# Patient Record
Sex: Male | Born: 1975
Health system: Southern US, Community
[De-identification: ages and names within clinical notes are randomized; demographics above are authoritative.]

## PROBLEM LIST (undated history)

## (undated) DIAGNOSIS — K649 Unspecified hemorrhoids: Secondary | ICD-10-CM

## (undated) DIAGNOSIS — E782 Mixed hyperlipidemia: Secondary | ICD-10-CM

## (undated) DIAGNOSIS — K219 Gastro-esophageal reflux disease without esophagitis: Secondary | ICD-10-CM

## (undated) HISTORY — PX: ROTATOR CUFF REPAIR: SHX139

## (undated) HISTORY — DX: Mixed hyperlipidemia: E78.2

## (undated) HISTORY — DX: Unspecified hemorrhoids: K64.9

---

## 2000-09-27 ENCOUNTER — Emergency Department (HOSPITAL_COMMUNITY): Admission: EM | Admit: 2000-09-27 | Discharge: 2000-09-27 | Payer: Self-pay

## 2004-04-29 ENCOUNTER — Emergency Department (HOSPITAL_COMMUNITY): Admission: EM | Admit: 2004-04-29 | Discharge: 2004-04-29 | Payer: Self-pay | Admitting: Emergency Medicine

## 2004-06-22 ENCOUNTER — Ambulatory Visit (HOSPITAL_COMMUNITY): Admission: RE | Admit: 2004-06-22 | Discharge: 2004-06-22 | Payer: Self-pay | Admitting: Family Medicine

## 2004-12-18 ENCOUNTER — Ambulatory Visit (HOSPITAL_COMMUNITY): Admission: RE | Admit: 2004-12-18 | Discharge: 2004-12-18 | Payer: Self-pay | Admitting: Family Medicine

## 2005-08-19 ENCOUNTER — Ambulatory Visit: Payer: Self-pay | Admitting: Internal Medicine

## 2005-09-07 ENCOUNTER — Ambulatory Visit (HOSPITAL_COMMUNITY): Admission: RE | Admit: 2005-09-07 | Discharge: 2005-09-07 | Payer: Self-pay | Admitting: Internal Medicine

## 2005-09-07 ENCOUNTER — Ambulatory Visit: Payer: Self-pay | Admitting: Internal Medicine

## 2005-09-07 HISTORY — PX: COLONOSCOPY: SHX174

## 2006-04-25 ENCOUNTER — Emergency Department (HOSPITAL_COMMUNITY): Admission: EM | Admit: 2006-04-25 | Discharge: 2006-04-26 | Payer: Self-pay | Admitting: Emergency Medicine

## 2006-04-28 ENCOUNTER — Ambulatory Visit (HOSPITAL_COMMUNITY): Admission: RE | Admit: 2006-04-28 | Discharge: 2006-04-28 | Payer: Self-pay | Admitting: Orthopaedic Surgery

## 2007-01-02 ENCOUNTER — Ambulatory Visit (HOSPITAL_BASED_OUTPATIENT_CLINIC_OR_DEPARTMENT_OTHER): Admission: RE | Admit: 2007-01-02 | Discharge: 2007-01-02 | Payer: Self-pay | Admitting: Orthopedic Surgery

## 2010-08-18 NOTE — Op Note (Signed)
Stephen [name], OBEY               ACCOUNT NO.:  [id number]   MEDICAL RECORD NO.:  [id number]          PATIENT TYPE:  AMB   LOCATION:  DSC                          FACILITY:  [facility]   PHYSICIAN:  Feliberto Gottron. Turner Daniels, M.D.   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   POSTOPERATIVE DIAGNOSIS:  Recurrent dislocations, left shoulder, with a  Bankart lesion by MRI scan.   PROCEDURE:  Open anterior superior capsular shift procedure with open  Bankart repair using a 3-mm push lock.   SURGEON:  Feliberto Gottron. Turner Daniels, M.D.   ASSISTANT:  Skip Mayer, P.A.-C.   ANESTHETIC:  Left shoulder interscalene block and general endotracheal.   ESTIMATED BLOOD LOSS:  Minimal.   FLUID REPLACEMENT:  800 mL crystalloid.   DRAINS PLACED:  None.   TOURNIQUET TIME:  None.   INDICATIONS FOR PROCEDURE:  The patient is a 35 year old man who in the  last couple of years has had 3 left shoulder anterior dislocations  documented by x-ray in the emergency room and requiring anesthetic to  reduce.  He has a small Hill-Sachs lesion on a recent MRI scan and a  fairly classic soft tissue Bankart lesion documented by MRI scan.  Back  a few months ago, he actually dislocated this rolling over in bed at  night.  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  The MRI scan was consistent with an anterior ballooned out capsule as  well.  Because of these findings, he desires elective left shoulder  anterior reconstruction with Bankart repair.  Risks and benefits of  surgery discussed, questions answered.  We are going to do this as an  open procedure because of the ballooning of the capsule which makes  arthroscopic stabilization somewhat difficult.   DESCRIPTION OF PROCEDURE:  The patient underwent left shoulder  interscalene block anesthesia in the block area at [facility] day  surgery center and then was taken to the operating room where the  appropriate anesthetic monitors were reattached, and he underwent  general endotracheal anesthesia.  He was then placed in the beach-chair  position/almost lying position and the left upper extremity prepped and  draped in the usual sterile fashion from the wrist to the hemithorax.  The skin from the axillary fold up towards the coracoid process was  infiltrated with 10 mL of half percent Marcaine and epinephrine  solution, and then a linear incision starting out just above the  axillary crease and going up towards and just above the coracoid process  was made through the skin and subcutaneous tissue.  Small bleeders were  identified and cauterized.  We went down to the deltopectoral interval,  identified the cephalic vein, and retracted it laterally with the  deltoid muscle.  This was exposed the clavipectoral fascia and the  subdeltoid bursa which were removed, exposing the conjoined tendon,  coming off of the coracoid process, and  the subscapularis muscle  beneath.  Using a self-retaining retractor, we then exposed the  subscapularis insertion.  This was taken off of the insertion on the  lesser tuberosity with the electrocautery and the curved Mayo scissors.  This was tagged with three #1 Ethibond suture using a grasping stitch,  allowing [place] to place the self-retaining retractor underneath the  subscapularis on top of the anterior shoulder capsule which was  ballooned out, especially anterior and inferiorly, consistent with his  recurrent dislocations.  This was then incised longitudinally starting  out superiorly and going down towards the inferior ballooned out aspect  of the capsule, and each side was tagged with #1 Ethilon sutures with a  grasping stitch, 4 medially and 3 laterally.  This exposed the  glenohumeral joint which was noted to be in good  condition with the  articular cartilage.  The patient had a fairly classic Bankart lesion  off of the anterior inferior glenoid.  Using a pilot drill for a 3-mm  push lock, a single pilot hole was placed on the anterior inferior rim  of the glenoid followed by tapping in preparation for 3-mm push lock.  A  #2 FiberWire was then passed through the capsule which been stripped off  of the anterior inferior acromion in a horizontal fashion and then two  limbs placed through the eye the push lock which was inserted, tensioned  and driven home.  This gave a firm stout repair of the anterior inferior  capsule labrum complex.  At this point, the wound was irrigated out with  normal saline solution, and then a capsular shift procedure, bringing  the ballooned out anterior inferior portion superior and lateral from  the medial limb and then superior and medial from the lateral limb was  accomplished.  This gave [place] a double-breasted repair of the capsule, and  the arm was held in 0 degrees of external rotation as the sutures were  tensioned and after the sutures had been tensioned external rotation  block at around 10 degrees.  Once again, the wound was then irrigated  out with normal saline solution.  The self-retaining retractor was  removed.  The subcutaneous tissue was closed with running 2-0 Vicryl  suture, the skin with skin staples and a dressing of Xeroform, 4x4  dressing, sponges, Hypafix tape and a shoulder immobilizer applied.  The  patient was then positioned supine, awakened and taken to the recovery  room without difficulty.      Feliberto Gottron. Turner Daniels, M.D.  Electronically Signed     FJR/MEDQ  D:  01/02/2007  T:  01/02/2007  Job:  [id number]

## 2010-08-21 NOTE — Op Note (Signed)
NAME:  [name], Stephen               ACCOUNT NO.:  [id number]   MEDICAL RECORD NO.:  [id number]          PATIENT TYPE:  AMB   LOCATION:  DAY                           FACILITY:  [facility]   PHYSICIAN:  R. Roetta Sessions, M.D. DATE OF BIRTH:  [birth year]   DATE OF PROCEDURE:  09/07/2005  DATE OF DISCHARGE:                                 OPERATIVE REPORT   PROCEDURE:  Diagnostic colonoscopy.   INDICATIONS FOR PROCEDURE:  A 35 year old gentleman with intermittent rectal  pain and intermittent rectal bleeding.  Colonoscopy is now being done.  This  approach has been discussed with patient at length.  The potential risks,  benefits, alternatives have been reviewed.  Patient tells me he had a [place]  sandwich yesterday.  He got very hungry and could not resist it.   PROCEDURE NOTE:  O2 saturation, blood pressure, pulse and respirations were  monitored throughout the entire procedure.  Conscious sedation with Versed 6  mg IV and Demerol 150 mg IV in divided doses.   INSTRUMENT:  Olympus video chip system.   FINDINGS:  Digital rectal exam revealed no abnormality.   ENDOSCOPIC FINDINGS:  Prep was marginal.   RECTUM:  On examination the rectal mucosa with retroflexion to the anal  verge revealed some minimal internal hemorrhoids.  Rectal mucosa otherwise  appeared normal.   COLON:  The colonic mucosa was surveyed from the rectosigmoid junction  through the left, transverse and right colon to the appendiceal orifice,  cecal valve and cecum.  These structures were well seen and photographed for  the record.  From this level, the scope was slowly withdrawn.  All  previously mentioned mucosal surfaces were again seen.  There was quite a  bit of semi-formed stool material throughout the colon which made the exam  more difficult.  I was able to wash and suction a good bit of it away, but  all the finer detail of mucosal surfaces were not seen because of the poor  prep (in large part due to the  patient's noncompliance with prep  instructions).  The colonic mucosa seen appeared entirely normal.  The  patient tolerated the procedure well and was reactive to endoscopist.   IMPRESSION:  Minimal internal hemorrhoids, otherwise normal rectum, normal  appearing colonic mucosa, however poor prep compromised exam.   I suspect the patient has experienced intermittent benign anorectal  bleeding.  He likely has an occult fissure, in large part exacerbated by  drinking large volumes of alcohol in binge manner over the weekends.   RECOMMENDATIONS:  1.  I have asked him to significantly curtail his alcohol consumption and      limit beer consumption over the weekend to no more than 6.  Hemorrhoid      literature provided to Stephen [name].  2.  Anusol HC suppositories 1 per rectum at bedtime x 10 days.  At his      request, we did a PSA on him through the office.  It was normal at 0.51.      Also, his liver profile and CBC came back normal.  Stephen [name] is to let me know if he has continued rectal bleeding or pain.      Stephen [name], M.D.  Electronically Signed     RMR/MEDQ  D:  09/07/2005  T:  09/07/2005  Job:  [id number]   cc:   [facility]

## 2010-08-21 NOTE — H&P (Signed)
NAME:  Stephen [name], Stephen [name]                   ACCOUNT NO.:  0   MEDICAL RECORD NO.:  [id number]           PATIENT TYPE:   LOCATION:                                 FACILITY:   PHYSICIAN:  R. Roetta Sessions, M.D. DATE OF BIRTH:  [birth year]   DATE OF ADMISSION:  08/19/2005  DATE OF DISCHARGE:  LH                                HISTORY & PHYSICAL   CHIEF COMPLAINT:  Hematochezia and proctalgia.   HISTORY OF PRESENT ILLNESS:  Mr. Stephen [name] is a pleasant, 5 year old,  Caucasian male followed by [facility] who came to see me on his own to  evaluate a 2- to 3-year history of intermittent, lancinating perianal rectal  pain which radiates towards his penis.  He may have some gross blood per  rectum from time to time, not at all necessarily associated with having a  bowel movement.  He denies constipation or diarrhea.  He has a tendency of  binge drinking consuming upwards of 24+ beers on a weekend which tends to  cause him to have more bowel movements and this is when he tends to have  more problems with burning and bleeding.  He has not really had any  abdominal pain or melena.  There has been no change in weight.  No upper GI  tract symptoms such as odynophagia, dysphagia, early satiety, reflux  symptoms, nausea or vomiting.   He tells me he has had rectal exams by various physicians.  He has never  taken anything topically for it, although it has been offered to him.  He  tells me he came to see me to get everything checked out.   PAST MEDICAL HISTORY:  Unremarkable for chronic illnesses or prior surgery,  although he tells me he did have some problems with increased urinary  frequency.  He describes Dr. Jerre [name] doing a cystoscopy at [facility]  several years ago without significant findings.   FAMILY HISTORY:  There is no family history of GI neoplasia or inflammatory  bowel disease.   CURRENT MEDICATIONS:  None.   ALLERGIES:  PENICILLIN.   FAMILY HISTORY:  Mother died  with a blood clot in her lung at age 29.  Father is age 73, alive and in good health, otherwise negative for chronic  GI or liver illness.   SOCIAL HISTORY:  The patient is married and has one step-child.  He has a  biological child on the way.  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  He smokes one to two cigarettes daily when he is drinking beer.  He had one DWI when he was 18.  He does not really drink during the week.  He does not use any illicit drugs.   REVIEW OF SYSTEMS:  As in history of present illness.  No change in weight,  fevers, chills, night sweats, chest pain or dyspnea on exertion.   PHYSICAL EXAMINATION:  GENERAL:  A pleasant, 35 year old gentleman resting  comfortably.  VITAL SIGNS:  Weight 173, height 5 feet 6 inches, temperature 98.4, BP  118/70, pulse 60.  SKIN:  Warm and dry.  No jaundice.  No stigmata of liver disease.  HEENT:  No scleral icterus.  NECK:  JVP not prominent.  CHEST:  Lungs clear to auscultation.  CARDIAC:  Regular rate and rhythm without murmurs, rubs or gallops.  ABDOMEN:  Nondistended, positive bowel sounds.  Soft, nontender without  appreciable mass or organomegaly.  EXTREMITIES:  No edema.  RECTAL:  Declined by the patient.  He wants everything done at the time of  colonoscopy.   IMPRESSION:  Mr. Stephen [name] is a pleasant, 35 year old gentleman with  intermittent rectal pain consistent with proctalgia in the setting of  intermittent bleeding and this is in the background setting of intermittent  binge alcohol consumption.   Symptoms sound more like a fissure more than anything else, however,  hematochezia needs to be thoroughly investigated.   PLAN:  I have recommended that Mr. Stephen [name] have a diagnostic colonoscopy in  the very near future.  Potential risks, benefits and alternatives have been  reviewed and questions answered.  He is agreeable.  He asked that a PSA  blood test be done and I have agreed to do so.  Will check a PSA  along with  a CBC and hepatic profile today.  Further recommendations to follow.      Jonathon Bellows, M.D.  Electronically Signed     RMR/MEDQ  D:  08/19/2005  T:  08/19/2005  Job:  [id number]   cc:   [facility]

## 2011-01-14 LAB — POCT HEMOGLOBIN-HEMACUE
Hemoglobin: 16.9
Operator id: 116011

## 2012-04-24 ENCOUNTER — Encounter: Payer: Self-pay | Admitting: Internal Medicine

## 2012-04-25 ENCOUNTER — Ambulatory Visit (INDEPENDENT_AMBULATORY_CARE_PROVIDER_SITE_OTHER): Payer: 59 | Admitting: Urgent Care

## 2012-04-25 ENCOUNTER — Encounter: Payer: Self-pay | Admitting: Urgent Care

## 2012-04-25 VITALS — BP 122/74 | HR 67 | Temp 98.0°F | Ht 66.0 in | Wt 177.2 lb

## 2012-04-25 DIAGNOSIS — K6289 Other specified diseases of anus and rectum: Secondary | ICD-10-CM

## 2012-04-25 DIAGNOSIS — K921 Melena: Secondary | ICD-10-CM

## 2012-04-25 MED ORDER — PEG 3350-KCL-NA BICARB-NACL 420 G PO SOLR: 4000.0000 mL | ORAL | Status: DC

## 2012-04-25 MED ORDER — LIDOCAINE-HYDROCORTISONE ACE 3-1 % RE KIT: 1.0000 "application " | PACK | Freq: Two times a day (BID) | RECTAL | Status: DC

## 2012-04-25 NOTE — Progress Notes (Signed)
Primary Care Physician:  Cassell Smiles., MD Primary Gastroenterologist:  Dr. Jena Gauss  Chief Complaint  Patient presents with  . Rectal Pain  . Hematochezia    HPI:  Stephen [name] is a 37 y.o. male here for hematochezia & proctalgia.  Last colonoscopy was in June 2007 for rectal bleeding.  He had a marginal prep.  He was found to have internal hemorrhoids only.  He describes pain "like a knife" in his anorectum.  C/o severe proctalgia after defecation.  He has noticed small amounts of bright red blood on the toilet tissue with wiping.  He tried Anucort only once or twice, but felt it made pain worse.  Denies constipation, diarrhea or abdominal pain.  Denies weight loss.  He is concerned he has prostate cancer.  He is unsure if he has had a recent PSA & he did have a routine physical.  He denies nausea, vomiting, dysphagia, odynophagia or anorexia.  He has rare heartburn.  He takes OTC Rolaids & they seem to help.  He only has symptoms a few times per month.  Denies NSAIDS.    Past Medical History  Diagnosis Date  . Hemorrhoids     Past Surgical History  Procedure Date  . Colonoscopy 09/07/2005    RMR: Minimal internal hemorrhoids, otherwise normal rectum, normal appearing colonic mucosa, however poor prep compromised exam (MARGINAL PREP)  . Rotator cuff repair     left    Current Outpatient Prescriptions  Medication Sig Dispense Refill  . lidocaine-hydrocortisone (ANAMANTLE) 3-1 % KIT Place 1 application rectally 2 (two) times daily.  20 each  0  . polyethylene glycol-electrolytes (TRILYTE) 420 G solution Take 4,000 mLs by mouth as directed.  4000 mL  0    Allergies as of 04/25/2012 - Review Complete 04/25/2012  Allergen Reaction Noted  . Penicillins Other (See Comments) 04/25/2012    Family History:There is no known family history of colorectal carcinoma , liver disease, or inflammatory bowel disease.   Problem Relation Age of Onset  . Pulmonary embolism Mother     History    Social History  . Marital Status: Married    Spouse Name: N/A    Number of Children: 3  . Years of Education: N/A   Occupational History  . Press Room Coordinator, [employer]    Social History Main Topics  . Smoking status: Current Some Day Smoker -- 0.2 packs/day    Types: Cigarettes  . Smokeless tobacco: Not on file  . Alcohol Use: Yes     Comment: 17 in a week, on weekends  . Drug Use: No  . Sexually Active: Not on file   Other Topics Concern  . Not on file   Social History Narrative  . No narrative on file    Review of Systems: Gen: see HPI CV: Denies chest pain, angina, palpitations, syncope, orthopnea, PND, peripheral edema, and claudication. Resp: Denies dyspnea at rest, dyspnea with exercise, cough, sputum, wheezing, coughing up blood, and pleurisy. GI: Denies vomiting blood, jaundice, and fecal incontinence.   GU : Denies urinary burning, blood in urine, urinary frequency, urinary hesitancy, nocturnal urination, and urinary incontinence. MS: Denies joint pain, limitation of movement, and swelling, stiffness, low back pain, extremity pain. Denies muscle weakness, cramps, atrophy.  Derm: Denies rash, itching, dry skin, hives, moles, warts, or unhealing ulcers.  Psych: Denies depression, anxiety, memory loss, suicidal ideation, hallucinations, paranoia, and confusion. Heme: Denies bruising or enlarged lymph nodes. Neuro:  Denies any headaches, dizziness, paresthesias.  Endo:  Denies any problems with DM, thyroid, adrenal function.  Physical Exam: BP 122/74  Pulse 67  Temp 98 F (36.7 C) (Oral)  Ht 5\' 6"  (1.676 m)  Wt 177 lb 3.2 oz (80.377 kg)  BMI 28.60 kg/m2 No LMP for male patient. General:   Alert,  Well-developed, well-nourished, pleasant and cooperative in NAD Head:  Normocephalic and atraumatic. Eyes:  Sclera clear, no icterus.   Conjunctiva pink. Ears:  Normal auditory acuity. Nose:  No deformity, discharge, or lesions. Mouth:  No deformity or  lesions,oropharynx pink & moist. Neck:  Supple; no masses or thyromegaly. Lungs:  Clear throughout to auscultation.   No wheezes, crackles, or rhonchi. No acute distress. Heart:  Regular rate and rhythm; no murmurs, clicks, rubs,  or gallops. Abdomen:  Normal bowel sounds.  No bruits.  Soft, non-tender and non-distended without masses, hepatosplenomegaly or hernias noted.  No guarding or rebound tenderness.   Rectal:  No external or internal lesions visualized or palpated, however he is extremely tender on exam.  Small amount light brown stool was obtained from vault & hemoccult negative. Msk:  Symmetrical without gross deformities.  Pulses:  Normal pulses noted. Extremities:  No clubbing or edema. Neurologic:  Alert and  oriented x4;  grossly normal neurologically. Skin:  Intact without significant lesions or rashes. Lymph Nodes:  No significant cervical adenopathy. Psych:  Alert and cooperative. Normal mood and affect.

## 2012-04-25 NOTE — Assessment & Plan Note (Addendum)
Stephen [name] is a pleasant 37 y.o. male with proctalgia & hematochezia.  On last colonoscopy in 2007 by Dr Jena Gauss, he had a marginal prep.  Given the fact that last colonoscopy was over 6 years ago & small lesions may have been missed due to marginal prep, he should have a diagnostic colonoscopy at this time.  Differentials include anorectal fissure, internal hemorrhoids, proctitis or IBD, or colorectal polyp or carcinoma.  Colonoscopy with Dr Jena Gauss.  I have discussed risks & benefits which include, but are not limited to, bleeding, infection, perforation & drug reaction.  The patient agrees with this plan & written consent will be obtained.    Anamantle Forte BID x 10 days

## 2012-04-25 NOTE — Patient Instructions (Addendum)
Colonoscopy with Dr Renaldo Harrison [facility] forte twice daily as directed

## 2012-04-25 NOTE — Progress Notes (Signed)
Faxed to PCP

## 2012-05-15 ENCOUNTER — Ambulatory Visit (HOSPITAL_COMMUNITY): Admission: RE | Admit: 2012-05-15 | Payer: 59 | Source: Ambulatory Visit | Admitting: Internal Medicine

## 2012-05-15 ENCOUNTER — Encounter (HOSPITAL_COMMUNITY): Admission: RE | Payer: Self-pay | Source: Ambulatory Visit

## 2012-05-15 SURGERY — COLONOSCOPY
Anesthesia: Moderate Sedation

## 2012-05-24 ENCOUNTER — Telehealth: Payer: Self-pay | Admitting: Internal Medicine

## 2012-05-24 NOTE — Telephone Encounter (Signed)
Pt called to [facility] his tcs. Please call him back at [phone]

## 2012-05-24 NOTE — Telephone Encounter (Signed)
Patient is going to have to come back in for another opv because 30 day H&P has ran out

## 2012-05-31 NOTE — Telephone Encounter (Signed)
DG had called pt to [facility] and patient said he would call us back to [facility] when he had time

## 2012-08-01 ENCOUNTER — Ambulatory Visit (INDEPENDENT_AMBULATORY_CARE_PROVIDER_SITE_OTHER): Payer: 59 | Admitting: Gastroenterology

## 2012-08-01 ENCOUNTER — Encounter: Payer: Self-pay | Admitting: Gastroenterology

## 2012-08-01 ENCOUNTER — Other Ambulatory Visit: Payer: Self-pay

## 2012-08-01 VITALS — BP 110/71 | HR 57 | Temp 98.1°F | Ht 67.0 in | Wt 176.0 lb

## 2012-08-01 DIAGNOSIS — K625 Hemorrhage of anus and rectum: Secondary | ICD-10-CM

## 2012-08-01 DIAGNOSIS — K6289 Other specified diseases of anus and rectum: Secondary | ICD-10-CM

## 2012-08-01 DIAGNOSIS — K921 Melena: Secondary | ICD-10-CM

## 2012-08-01 DIAGNOSIS — K649 Unspecified hemorrhoids: Secondary | ICD-10-CM

## 2012-08-01 MED ORDER — PEG-KCL-NACL-NASULF-NA ASC-C 100 G PO SOLR: 1.0000 | ORAL | Status: DC

## 2012-08-01 NOTE — Assessment & Plan Note (Signed)
37 year old gentleman with history of intermittent proctalgia and hematochezia. Last colonoscopy in 2007 by Dr. Jena Gauss with a marginal prep. Differentials include anorectal fissure, hemorrhoids, proctitis or IBD, colorectal polyp or carcinoma. Colonoscopy in the near future with Dr. Jena Gauss.  I have discussed the risks, alternatives, benefits with regards to but not limited to the risk of reaction to medication, bleeding, infection, perforation and the patient is agreeable to proceed. Written consent to be obtained.

## 2012-08-01 NOTE — Patient Instructions (Addendum)
We have scheduled you for colonoscopy with Dr. Jena Gauss. Please see separate instructions.

## 2012-08-01 NOTE — Progress Notes (Signed)
Primary Care Physician:  FUSCO,LAWRENCE J., MD  Primary Gastroenterologist:  Michael Rourk, MD  Chief Complaint  Patient presents with  . Colonoscopy    HPI:  Stephen [name] is a 37 y.o. male here to schedule colonoscopy. He was last seen in January 2014 for hematochezia and proctalgia. Last colonoscopy June 2007 for rectal bleeding. He had a marginal prep at that time. He was found to have internal hemorrhoids only. He was scheduled for colonoscopy at his last office visit that he had to cancel it. He states he was doing well up until recently. He has had recurrent rectal pain especially after bowel movements. Difficult to sit at times due to the pain. Intermittent small amounts of bright red blood on the toilet tissue with wiping. Denies constipation, diarrhea, abdominal pain, weight loss. Denies nausea or vomiting, dysphagia, odynophagia, anorexia. Rarely has heartburn. Takes over-the-counter Rolaids at times which help. Denies NSAIDs.  Current Outpatient Prescriptions  Medication Sig Dispense Refill  . lidocaine-hydrocortisone (ANAMANTLE) 3-1 % KIT Place 1 application rectally 2 (two) times daily.  20 each  0  . polyethylene glycol-electrolytes (TRILYTE) 420 G solution Take 4,000 mLs by mouth as directed.  4000 mL  0   No current facility-administered medications for this visit.    Allergies as of 08/01/2012 - Review Complete 08/01/2012  Allergen Reaction Noted  . Penicillins Other (See Comments) 04/25/2012    Past Medical History  Diagnosis Date  . Hemorrhoids     Past Surgical History  Procedure Laterality Date  . Colonoscopy  09/07/2005    RMR: Minimal internal hemorrhoids, otherwise normal rectum, normal appearing colonic mucosa, however poor prep compromised exam (MARGINAL PREP)  . Rotator cuff repair      left    Family History  Problem Relation Age of Onset  . Pulmonary embolism Mother     History   Social History  . Marital Status: Married    Spouse Name:  N/A    Number of Children: 3  . Years of Education: N/A   Occupational History  . Press Room Coordinator, [employer]    Social History Main Topics  . Smoking status: Current Some Day Smoker -- 0.20 packs/day    Types: Cigarettes  . Smokeless tobacco: Not on file  . Alcohol Use: Yes     Comment: 17 in a week, on weekends  . Drug Use: No  . Sexually Active: Not on file   Other Topics Concern  . Not on file   Social History Narrative  . No narrative on file      ROS:  General: Negative for anorexia, weight loss, fever, chills, fatigue, weakness. Eyes: Negative for vision changes.  ENT: Negative for hoarseness, difficulty swallowing , nasal congestion. CV: Negative for chest pain, angina, palpitations, dyspnea on exertion, peripheral edema.  Respiratory: Negative for dyspnea at rest, dyspnea on exertion, cough, sputum, wheezing.  GI: See history of present illness. GU:  Negative for dysuria, hematuria, urinary incontinence, urinary frequency, nocturnal urination.  MS: Negative for joint pain, low back pain.  Derm: Negative for rash or itching.  Neuro: Negative for weakness, abnormal sensation, seizure, frequent headaches, memory loss, confusion.  Psych: Negative for anxiety, depression, suicidal ideation, hallucinations.  Endo: Negative for unusual weight change.  Heme: Negative for bruising or bleeding. Allergy: Negative for rash or hives.    Physical Examination:  BP 110/71  Pulse 57  Temp(Src) 98.1 F (36.7 C) (Oral)  Ht 5' 7" (1.702 m)  Wt 176 lb (  79.833 kg)  BMI 27.56 kg/m2   General: Well-nourished, well-developed in no acute distress.  Head: Normocephalic, atraumatic.   Eyes: Conjunctiva pink, no icterus. Mouth: Oropharyngeal mucosa moist and pink , no lesions erythema or exudate. Neck: Supple without thyromegaly, masses, or lymphadenopathy.  Lungs: Clear to auscultation bilaterally.  Heart: Regular rate and rhythm, no murmurs rubs or gallops.  Abdomen:  Bowel sounds are normal, nontender, nondistended, no hepatosplenomegaly or masses, no abdominal bruits or    hernia , no rebound or guarding.   Rectal: Not performed Extremities: No lower extremity edema. No clubbing or deformities.  Neuro: Alert and oriented x 4 , grossly normal neurologically.  Skin: Warm and dry, no rash or jaundice.   Psych: Alert and cooperative, normal mood and affect.   

## 2012-08-01 NOTE — Progress Notes (Signed)
Cc PCP 

## 2012-08-01 NOTE — Addendum Note (Signed)
Addended by: Lavena Bullion on: 08/01/2012 08:53 AM   Modules accepted: Orders

## 2012-08-04 ENCOUNTER — Encounter (HOSPITAL_COMMUNITY): Payer: Self-pay | Admitting: Pharmacy Technician

## 2012-08-17 ENCOUNTER — Ambulatory Visit (HOSPITAL_COMMUNITY)
Admission: RE | Admit: 2012-08-17 | Discharge: 2012-08-17 | Disposition: A | Discharge status: Home / Self Care | Payer: 59 | Source: Ambulatory Visit | Attending: Internal Medicine | Admitting: Internal Medicine

## 2012-08-17 ENCOUNTER — Encounter (HOSPITAL_COMMUNITY)
Admission: RE | Disposition: A | Discharge status: Home / Self Care | Payer: Self-pay | Source: Ambulatory Visit | Attending: Internal Medicine

## 2012-08-17 ENCOUNTER — Encounter (HOSPITAL_COMMUNITY): Payer: Self-pay | Admitting: *Deleted

## 2012-08-17 DIAGNOSIS — K921 Melena: Secondary | ICD-10-CM

## 2012-08-17 DIAGNOSIS — K649 Unspecified hemorrhoids: Secondary | ICD-10-CM

## 2012-08-17 DIAGNOSIS — K602 Anal fissure, unspecified: Secondary | ICD-10-CM | POA: Insufficient documentation

## 2012-08-17 DIAGNOSIS — K625 Hemorrhage of anus and rectum: Secondary | ICD-10-CM

## 2012-08-17 DIAGNOSIS — K6289 Other specified diseases of anus and rectum: Secondary | ICD-10-CM

## 2012-08-17 HISTORY — PX: COLONOSCOPY: SHX5424

## 2012-08-17 SURGERY — COLONOSCOPY
Anesthesia: Moderate Sedation

## 2012-08-17 MED ORDER — SODIUM CHLORIDE 0.9 % IV SOLN
INTRAVENOUS | Status: DC
  Administered 2012-08-17: 1000 mL via INTRAVENOUS

## 2012-08-17 MED ORDER — ONDANSETRON HCL 4 MG/2ML IJ SOLN
INTRAMUSCULAR | Status: AC
  Filled 2012-08-17: qty 2

## 2012-08-17 MED ORDER — PROMETHAZINE HCL 25 MG/ML IJ SOLN
INTRAMUSCULAR | Status: DC | PRN
  Administered 2012-08-17: 12.5 mg via INTRAVENOUS

## 2012-08-17 MED ORDER — STERILE WATER FOR IRRIGATION IR SOLN
Status: DC | PRN
  Administered 2012-08-17: 12:00:00

## 2012-08-17 MED ORDER — MIDAZOLAM HCL 5 MG/5ML IJ SOLN
INTRAMUSCULAR | Status: AC
  Filled 2012-08-17: qty 10

## 2012-08-17 MED ORDER — MEPERIDINE HCL 100 MG/ML IJ SOLN
INTRAMUSCULAR | Status: AC
  Filled 2012-08-17: qty 1

## 2012-08-17 MED ORDER — LIDOCAINE HCL 2 % EX GEL
CUTANEOUS | Status: DC | PRN
  Administered 2012-08-17: 1 via TOPICAL

## 2012-08-17 MED ORDER — MIDAZOLAM HCL 5 MG/5ML IJ SOLN
INTRAMUSCULAR | Status: DC | PRN
  Administered 2012-08-17: 2 mg via INTRAVENOUS
  Administered 2012-08-17: 1 mg via INTRAVENOUS
  Administered 2012-08-17 (×2): 2 mg via INTRAVENOUS

## 2012-08-17 MED ORDER — LIDOCAINE HCL 2 % EX GEL
CUTANEOUS | Status: AC
  Filled 2012-08-17: qty 30

## 2012-08-17 MED ORDER — MEPERIDINE HCL 100 MG/ML IJ SOLN
INTRAMUSCULAR | Status: DC | PRN
  Administered 2012-08-17 (×3): 50 mg via INTRAVENOUS

## 2012-08-17 MED ORDER — ONDANSETRON HCL 4 MG/2ML IJ SOLN
INTRAMUSCULAR | Status: DC | PRN
  Administered 2012-08-17: 4 mg via INTRAVENOUS

## 2012-08-17 MED ORDER — PROMETHAZINE HCL 25 MG/ML IJ SOLN
INTRAMUSCULAR | Status: AC
  Filled 2012-08-17: qty 1

## 2012-08-17 NOTE — Op Note (Signed)
[facility] [address] [place], [address]   COLONOSCOPY PROCEDURE REPORT  PATIENT: Stephen [name], Stephen [name]  MR#:         [id number] BIRTHDATE: December 30, 1975 , 37  yrs. old GENDER: Male ENDOSCOPIST: R.  Roetta Sessions, MD FACP FACG REFERRED BY:  Artis Delay, M.D. PROCEDURE DATE:  08/17/2012 PROCEDURE:     diagnostic ileocolonoscopy  INDICATIONS: anorectal pain; paper hematochezia  INFORMED CONSENT:  The risks, benefits, alternatives and imponderables including but not limited to bleeding, perforation as well as the possibility of a missed lesion have been reviewed.  The potential for biopsy, lesion removal, etc. have also been discussed.  Questions have been answered.  All parties agreeable. Please see the history and physical in the medical record for more information.  MEDICATIONS: Versed 7 mg IV and Demerol 150 mg IV in divided doses. Phenergan 12.5 mg the. Zofran 4 mg IV  DESCRIPTION OF PROCEDURE:  After a digital rectal exam was performed, the EC-[id number] ([id number])  colonoscope was advanced from the anus through the rectum and colon to the area of the cecum, ileocecal valve and appendiceal orifice.  The cecum was deeply intubated.  These structures were well-seen and photographed for the record.  From the level of the cecum and ileocecal valve, the scope was slowly and cautiously withdrawn.  The mucosal surfaces were carefully surveyed utilizing scope tip deflection to facilitate fold flattening as needed.  The scope was pulled down into the rectum where a thorough examination including retroflexion was performed.    FINDINGS:  Adequate preparation. Exquisitely tender anal canal to digital examination. Normal rectal mucosa. Retroflexion reveals no abnormalities. No hemorrhoids.  Normal-appearing colonic mucosa. The distal 10 cm of terminal ileal mucosa also appeared normal  THERAPEUTIC / DIAGNOSTIC MANEUVERS PERFORMED:  None  COMPLICATIONS: None  CECAL  WITHDRAWAL TIME:  7 minutes  IMPRESSION:  Normal rectum, colon and terminal ileum. Exquisitely tender anal canal most just above the anal fissure. No significant hemorrhoidal disease. Symptoms are not consistent with proctalgia fugax  RECOMMENDATIONS:   Trial of nitroglycerin topical ointment; if no improvement, patient will need surgical referral. Discussed with wife.   _______________________________ eSigned:  R. Roetta Sessions, MD FACP [facility] 08/17/2012 12:32 PM   CC:

## 2012-08-17 NOTE — Interval H&P Note (Signed)
History and Physical Interval Note:  08/17/2012 11:44 AM  Maggie Font  has presented today for surgery, with the diagnosis of Rectal pain/ BRBPR/Hemorrhoids  The various methods of treatment have been discussed with the patient and family. After consideration of risks, benefits and other options for treatment, the patient has consented to  Procedure(s) with comments: COLONOSCOPY (N/A) - 10:45 AM-moved to [address] to notify pt as a surgical intervention .  The patient's history has been reviewed, patient examined, no change in status, stable for surgery.  I have reviewed the patient's chart and labs.  Questions were answered to the patient's satisfaction.     Heidie Krall  Topical lidocaine and hydrocortisone have not helped rectal pain. Colonoscopy per plan.  The risks, benefits, limitations, alternatives and imponderables have been reviewed with the patient. Questions have been answered. All parties are agreeable.

## 2012-08-17 NOTE — H&P (View-Only) (Signed)
Primary Care Physician:  Cassell Smiles., MD  Primary Gastroenterologist:  Roetta Sessions, MD  Chief Complaint  Patient presents with  . Colonoscopy    HPI:  Stephen [name] is a 37 y.o. male here to schedule colonoscopy. He was last seen in January 2014 for hematochezia and proctalgia. Last colonoscopy June 2007 for rectal bleeding. He had a marginal prep at that time. He was found to have internal hemorrhoids only. He was scheduled for colonoscopy at his last office visit that he had to cancel it. He states he was doing well up until recently. He has had recurrent rectal pain especially after bowel movements. Difficult to sit at times due to the pain. Intermittent small amounts of bright red blood on the toilet tissue with wiping. Denies constipation, diarrhea, abdominal pain, weight loss. Denies nausea or vomiting, dysphagia, odynophagia, anorexia. Rarely has heartburn. Takes over-the-counter Rolaids at times which help. Denies NSAIDs.  Current Outpatient Prescriptions  Medication Sig Dispense Refill  . lidocaine-hydrocortisone (ANAMANTLE) 3-1 % KIT Place 1 application rectally 2 (two) times daily.  20 each  0  . polyethylene glycol-electrolytes (TRILYTE) 420 G solution Take 4,000 mLs by mouth as directed.  4000 mL  0   No current facility-administered medications for this visit.    Allergies as of 08/01/2012 - Review Complete 08/01/2012  Allergen Reaction Noted  . Penicillins Other (See Comments) 04/25/2012    Past Medical History  Diagnosis Date  . Hemorrhoids     Past Surgical History  Procedure Laterality Date  . Colonoscopy  09/07/2005    RMR: Minimal internal hemorrhoids, otherwise normal rectum, normal appearing colonic mucosa, however poor prep compromised exam (MARGINAL PREP)  . Rotator cuff repair      left    Family History  Problem Relation Age of Onset  . Pulmonary embolism Mother     History   Social History  . Marital Status: Married    Spouse Name:  N/A    Number of Children: 3  . Years of Education: N/A   Occupational History  . Press Room Coordinator, [employer]    Social History Main Topics  . Smoking status: Current Some Day Smoker -- 0.20 packs/day    Types: Cigarettes  . Smokeless tobacco: Not on file  . Alcohol Use: Yes     Comment: 18 in a week, on weekends  . Drug Use: No  . Sexually Active: Not on file   Other Topics Concern  . Not on file   Social History Narrative  . No narrative on file      ROS:  General: Negative for anorexia, weight loss, fever, chills, fatigue, weakness. Eyes: Negative for vision changes.  ENT: Negative for hoarseness, difficulty swallowing , nasal congestion. CV: Negative for chest pain, angina, palpitations, dyspnea on exertion, peripheral edema.  Respiratory: Negative for dyspnea at rest, dyspnea on exertion, cough, sputum, wheezing.  GI: See history of present illness. GU:  Negative for dysuria, hematuria, urinary incontinence, urinary frequency, nocturnal urination.  MS: Negative for joint pain, low back pain.  Derm: Negative for rash or itching.  Neuro: Negative for weakness, abnormal sensation, seizure, frequent headaches, memory loss, confusion.  Psych: Negative for anxiety, depression, suicidal ideation, hallucinations.  Endo: Negative for unusual weight change.  Heme: Negative for bruising or bleeding. Allergy: Negative for rash or hives.    Physical Examination:  BP 110/71  Pulse 57  Temp(Src) 98.1 F (36.7 C) (Oral)  Ht 5\' 7"  (1.702 m)  Wt 176 lb (  79.833 kg)  BMI 27.56 kg/m2   General: Well-nourished, well-developed in no acute distress.  Head: Normocephalic, atraumatic.   Eyes: Conjunctiva pink, no icterus. Mouth: Oropharyngeal mucosa moist and pink , no lesions erythema or exudate. Neck: Supple without thyromegaly, masses, or lymphadenopathy.  Lungs: Clear to auscultation bilaterally.  Heart: Regular rate and rhythm, no murmurs rubs or gallops.  Abdomen:  Bowel sounds are normal, nontender, nondistended, no hepatosplenomegaly or masses, no abdominal bruits or    hernia , no rebound or guarding.   Rectal: Not performed Extremities: No lower extremity edema. No clubbing or deformities.  Neuro: Alert and oriented x 4 , grossly normal neurologically.  Skin: Warm and dry, no rash or jaundice.   Psych: Alert and cooperative, normal mood and affect.

## 2012-08-18 ENCOUNTER — Encounter (HOSPITAL_COMMUNITY): Payer: Self-pay | Admitting: Internal Medicine

## 2016-02-03 DIAGNOSIS — Z0000 Encounter for general adult medical examination without abnormal findings: Secondary | ICD-10-CM | POA: Diagnosis not present

## 2016-02-03 DIAGNOSIS — Z6828 Body mass index (BMI) 28.0-28.9, adult: Secondary | ICD-10-CM | POA: Diagnosis not present

## 2016-02-03 DIAGNOSIS — Z1389 Encounter for screening for other disorder: Secondary | ICD-10-CM | POA: Diagnosis not present

## 2016-02-03 DIAGNOSIS — E748 Other specified disorders of carbohydrate metabolism: Secondary | ICD-10-CM | POA: Diagnosis not present

## 2016-02-03 DIAGNOSIS — F419 Anxiety disorder, unspecified: Secondary | ICD-10-CM | POA: Diagnosis not present

## 2016-02-03 DIAGNOSIS — E663 Overweight: Secondary | ICD-10-CM | POA: Diagnosis not present

## 2016-04-19 DIAGNOSIS — L600 Ingrowing nail: Secondary | ICD-10-CM | POA: Diagnosis not present

## 2016-04-19 DIAGNOSIS — M79673 Pain in unspecified foot: Secondary | ICD-10-CM | POA: Diagnosis not present

## 2016-11-22 DIAGNOSIS — Z0000 Encounter for general adult medical examination without abnormal findings: Secondary | ICD-10-CM | POA: Diagnosis not present

## 2016-11-22 DIAGNOSIS — E663 Overweight: Secondary | ICD-10-CM | POA: Diagnosis not present

## 2016-11-22 DIAGNOSIS — Z1389 Encounter for screening for other disorder: Secondary | ICD-10-CM | POA: Diagnosis not present

## 2016-11-22 DIAGNOSIS — Z6828 Body mass index (BMI) 28.0-28.9, adult: Secondary | ICD-10-CM | POA: Diagnosis not present

## 2017-02-14 DIAGNOSIS — H43393 Other vitreous opacities, bilateral: Secondary | ICD-10-CM | POA: Diagnosis not present

## 2017-05-18 DIAGNOSIS — M2021 Hallux rigidus, right foot: Secondary | ICD-10-CM | POA: Diagnosis not present

## 2017-05-18 DIAGNOSIS — M79671 Pain in right foot: Secondary | ICD-10-CM | POA: Diagnosis not present

## 2017-05-18 DIAGNOSIS — M2022 Hallux rigidus, left foot: Secondary | ICD-10-CM | POA: Diagnosis not present

## 2017-05-18 DIAGNOSIS — M79672 Pain in left foot: Secondary | ICD-10-CM | POA: Diagnosis not present

## 2017-06-08 DIAGNOSIS — M205X1 Other deformities of toe(s) (acquired), right foot: Secondary | ICD-10-CM | POA: Diagnosis not present

## 2017-06-08 DIAGNOSIS — M19072 Primary osteoarthritis, left ankle and foot: Secondary | ICD-10-CM | POA: Diagnosis not present

## 2017-06-08 DIAGNOSIS — M7751 Other enthesopathy of right foot: Secondary | ICD-10-CM | POA: Diagnosis not present

## 2017-06-08 DIAGNOSIS — M19071 Primary osteoarthritis, right ankle and foot: Secondary | ICD-10-CM | POA: Diagnosis not present

## 2017-06-08 DIAGNOSIS — M659 Synovitis and tenosynovitis, unspecified: Secondary | ICD-10-CM | POA: Diagnosis not present

## 2017-11-23 DIAGNOSIS — R12 Heartburn: Secondary | ICD-10-CM | POA: Diagnosis not present

## 2017-11-23 DIAGNOSIS — M940 Chondrocostal junction syndrome [Tietze]: Secondary | ICD-10-CM | POA: Diagnosis not present

## 2017-11-23 DIAGNOSIS — Z6828 Body mass index (BMI) 28.0-28.9, adult: Secondary | ICD-10-CM | POA: Diagnosis not present

## 2017-11-23 DIAGNOSIS — Z0000 Encounter for general adult medical examination without abnormal findings: Secondary | ICD-10-CM | POA: Diagnosis not present

## 2017-11-23 DIAGNOSIS — Z1389 Encounter for screening for other disorder: Secondary | ICD-10-CM | POA: Diagnosis not present

## 2017-11-23 DIAGNOSIS — E663 Overweight: Secondary | ICD-10-CM | POA: Diagnosis not present

## 2017-11-23 DIAGNOSIS — Z0001 Encounter for general adult medical examination with abnormal findings: Secondary | ICD-10-CM | POA: Diagnosis not present

## 2017-11-23 DIAGNOSIS — E782 Mixed hyperlipidemia: Secondary | ICD-10-CM | POA: Diagnosis not present

## 2018-02-09 DIAGNOSIS — Z6828 Body mass index (BMI) 28.0-28.9, adult: Secondary | ICD-10-CM | POA: Diagnosis not present

## 2018-02-09 DIAGNOSIS — K219 Gastro-esophageal reflux disease without esophagitis: Secondary | ICD-10-CM | POA: Diagnosis not present

## 2018-02-09 DIAGNOSIS — E663 Overweight: Secondary | ICD-10-CM | POA: Diagnosis not present

## 2018-02-09 DIAGNOSIS — Z1389 Encounter for screening for other disorder: Secondary | ICD-10-CM | POA: Diagnosis not present

## 2018-02-09 DIAGNOSIS — R0789 Other chest pain: Secondary | ICD-10-CM | POA: Diagnosis not present

## 2018-03-06 ENCOUNTER — Ambulatory Visit (INDEPENDENT_AMBULATORY_CARE_PROVIDER_SITE_OTHER): Payer: Self-pay | Admitting: Internal Medicine

## 2018-06-26 DIAGNOSIS — E663 Overweight: Secondary | ICD-10-CM | POA: Diagnosis not present

## 2018-06-26 DIAGNOSIS — Z1389 Encounter for screening for other disorder: Secondary | ICD-10-CM | POA: Diagnosis not present

## 2018-06-26 DIAGNOSIS — Z6827 Body mass index (BMI) 27.0-27.9, adult: Secondary | ICD-10-CM | POA: Diagnosis not present

## 2018-06-26 DIAGNOSIS — J069 Acute upper respiratory infection, unspecified: Secondary | ICD-10-CM | POA: Diagnosis not present

## 2018-11-27 DIAGNOSIS — Z6827 Body mass index (BMI) 27.0-27.9, adult: Secondary | ICD-10-CM | POA: Diagnosis not present

## 2018-11-27 DIAGNOSIS — E7849 Other hyperlipidemia: Secondary | ICD-10-CM | POA: Diagnosis not present

## 2018-11-27 DIAGNOSIS — E663 Overweight: Secondary | ICD-10-CM | POA: Diagnosis not present

## 2018-11-27 DIAGNOSIS — Z0001 Encounter for general adult medical examination with abnormal findings: Secondary | ICD-10-CM | POA: Diagnosis not present

## 2019-05-10 ENCOUNTER — Other Ambulatory Visit: Payer: Self-pay

## 2019-05-10 ENCOUNTER — Ambulatory Visit: Payer: BC Managed Care – PPO | Attending: Internal Medicine

## 2019-05-10 DIAGNOSIS — Z20822 Contact with and (suspected) exposure to covid-19: Secondary | ICD-10-CM

## 2019-05-11 LAB — NOVEL CORONAVIRUS, NAA: SARS-CoV-2, NAA: NOT DETECTED

## 2019-05-15 ENCOUNTER — Other Ambulatory Visit: Payer: Self-pay

## 2019-05-15 ENCOUNTER — Ambulatory Visit: Payer: BC Managed Care – PPO | Attending: Internal Medicine

## 2019-05-15 DIAGNOSIS — U071 COVID-19: Secondary | ICD-10-CM | POA: Insufficient documentation

## 2019-05-15 DIAGNOSIS — Z20822 Contact with and (suspected) exposure to covid-19: Secondary | ICD-10-CM

## 2019-05-16 LAB — NOVEL CORONAVIRUS, NAA: SARS-CoV-2, NAA: DETECTED — AB

## 2019-05-17 DIAGNOSIS — R432 Parageusia: Secondary | ICD-10-CM | POA: Diagnosis not present

## 2019-05-17 DIAGNOSIS — U071 COVID-19: Secondary | ICD-10-CM | POA: Diagnosis not present

## 2019-05-17 DIAGNOSIS — R430 Anosmia: Secondary | ICD-10-CM | POA: Diagnosis not present

## 2019-12-04 DIAGNOSIS — Z1159 Encounter for screening for other viral diseases: Secondary | ICD-10-CM | POA: Diagnosis not present

## 2019-12-04 DIAGNOSIS — E663 Overweight: Secondary | ICD-10-CM | POA: Diagnosis not present

## 2019-12-04 DIAGNOSIS — Z6827 Body mass index (BMI) 27.0-27.9, adult: Secondary | ICD-10-CM | POA: Diagnosis not present

## 2019-12-04 DIAGNOSIS — Z1389 Encounter for screening for other disorder: Secondary | ICD-10-CM | POA: Diagnosis not present

## 2019-12-04 DIAGNOSIS — Z0000 Encounter for general adult medical examination without abnormal findings: Secondary | ICD-10-CM | POA: Diagnosis not present

## 2020-01-18 DIAGNOSIS — E7849 Other hyperlipidemia: Secondary | ICD-10-CM | POA: Diagnosis not present

## 2020-01-18 DIAGNOSIS — R7309 Other abnormal glucose: Secondary | ICD-10-CM | POA: Diagnosis not present

## 2020-01-18 DIAGNOSIS — Z6826 Body mass index (BMI) 26.0-26.9, adult: Secondary | ICD-10-CM | POA: Diagnosis not present

## 2020-01-18 DIAGNOSIS — E781 Pure hyperglyceridemia: Secondary | ICD-10-CM | POA: Diagnosis not present

## 2020-03-10 DIAGNOSIS — E785 Hyperlipidemia, unspecified: Secondary | ICD-10-CM | POA: Diagnosis not present

## 2020-03-10 DIAGNOSIS — Z6825 Body mass index (BMI) 25.0-25.9, adult: Secondary | ICD-10-CM | POA: Diagnosis not present

## 2020-03-10 DIAGNOSIS — E663 Overweight: Secondary | ICD-10-CM | POA: Diagnosis not present

## 2020-03-10 DIAGNOSIS — E781 Pure hyperglyceridemia: Secondary | ICD-10-CM | POA: Diagnosis not present

## 2020-05-14 ENCOUNTER — Encounter: Payer: Self-pay | Admitting: Internal Medicine

## 2020-05-14 ENCOUNTER — Encounter: Payer: Self-pay | Admitting: Nurse Practitioner

## 2020-05-14 ENCOUNTER — Other Ambulatory Visit: Payer: Self-pay

## 2020-05-14 ENCOUNTER — Ambulatory Visit (INDEPENDENT_AMBULATORY_CARE_PROVIDER_SITE_OTHER): Payer: BC Managed Care – PPO | Admitting: Nurse Practitioner

## 2020-05-14 VITALS — BP 114/73 | HR 68 | Temp 97.3°F | Ht 67.0 in | Wt 162.6 lb

## 2020-05-14 DIAGNOSIS — K649 Unspecified hemorrhoids: Secondary | ICD-10-CM | POA: Diagnosis not present

## 2020-05-14 DIAGNOSIS — K6289 Other specified diseases of anus and rectum: Secondary | ICD-10-CM | POA: Diagnosis not present

## 2020-05-14 DIAGNOSIS — K5900 Constipation, unspecified: Secondary | ICD-10-CM | POA: Insufficient documentation

## 2020-05-14 NOTE — Progress Notes (Signed)
Referring Provider: Elfredia Nevins, MD Primary Care Physician:  Elfredia Nevins, MD Primary GI:  Dr. Jena Gauss  Chief Complaint  Patient presents with  . Rectal Pain    Has seen little bit of blood  . Constipation    Sometimes strains, has bm daily    HPI:   Stephen [name] is a 45 y.o. male who presents for rectal pain.  The patient has not been seen by our office since 08/01/2012 when he was seen for hematochezia and proctalgia.  History of internal hemorrhoids.  Prior to his last visit had recurrent rectal pain after bowel movements with difficulty sitting.  No ongoing constipation or abdominal pain.  No other overt GI complaints.  Recommended colonoscopy.  Colonoscopy was completed 08/17/2012 which found exquisitely tender anal canal to digital rectal exam, normal mucosa, no hemorrhoids.  Appears to also have anal fissure.  Symptoms not consistent with proctalgia fugax.  Recommended trial of nitroglycerin topical ointment and if no improvement will require surgical referral.  Today states doing okay overall. He cannot remember if ntg topical helped last time. Has had intermittent rectal pain since October. Recent change in diet from triglyceride elevation. Has intermittent rectal pain, sometimes sharp and razor like. Sometimes throbbing. Always when he uses he bathroom, sometimes at night. Has daily bowel movements, requires some straining, taking longer then normal recently. Post-BM pain tends to linger. Denies hematochezia, melena. Has rectal itching/irritation as well with known hemorrhoids. Denies abdominal pain, N/V, melena, fever, chills. Notes initial intentional weight loss with diet/exercise from 183 to 163. Has not lost any more weight, has gone back to previous diet. Denies URI or flu-like symptoms. Denies loss of sense of taste or smell. The patient has not received COVID-19 vaccination(s). They are not interested in vaccine scheduling information. Denies chest pain, dyspnea,  dizziness, lightheadedness, syncope, near syncope. Denies any other upper or lower GI symptoms.  At the end of the visit he noted a "hard area" just on the edge/inside of his rectum and inquired about a rectal exam which was completed.  Past Medical History:  Diagnosis Date  . Elevated triglycerides with high cholesterol   . Hemorrhoids     Past Surgical History:  Procedure Laterality Date  . COLONOSCOPY  09/07/2005   RMR: Minimal internal hemorrhoids, otherwise normal rectum, normal appearing colonic mucosa, however poor prep compromised exam (MARGINAL PREP)  . COLONOSCOPY N/A 08/17/2012   Procedure: COLONOSCOPY;  Surgeon: Corbin Ade, MD;  Location: [facility];  Service: Endoscopy;  Laterality: N/A;  10:45 AM-moved to [address] to notify pt  . ROTATOR CUFF REPAIR     left    No current outpatient medications on file.   No current facility-administered medications for this visit.    Allergies as of 05/14/2020 - Review Complete 05/14/2020  Allergen Reaction Noted  . Penicillins Other (See Comments) 04/25/2012    Family History  Problem Relation Age of Onset  . Pulmonary embolism Mother   . Cancer Mother   . Colon cancer Neg Hx     Social History   Socioeconomic History  . Marital status: Married    Spouse name: Not on file  . Number of children: 3  . Years of education: Not on file  . Highest education level: Not on file  Occupational History  . Occupation: Customer service manager, Therapist, [employer]  Tobacco Use  . Smoking status: Former Smoker    Packs/day: 0.20    Years: 18.00  Pack years: 3.60    Types: Cigarettes  . Smokeless tobacco: Never Used  Substance and Sexual Activity  . Alcohol use: Yes    Comment: 18 beers in a week, on weekends; 05/14/20 6 beers once a week  . Drug use: No  . Sexual activity: Not on file  Other Topics Concern  . Not on file  Social History Narrative  . Not on file   Social Determinants of Health   Financial Resource  Strain: Not on file  Food Insecurity: Not on file  Transportation Needs: Not on file  Physical Activity: Not on file  Stress: Not on file  Social Connections: Not on file    Subjective: Review of Systems  Constitutional: Negative for chills, fever, malaise/fatigue and weight loss.  HENT: Negative for congestion and sore throat.   Respiratory: Negative for cough and shortness of breath.   Cardiovascular: Negative for chest pain and palpitations.  Gastrointestinal: Positive for constipation (Straining; no hard stools). Negative for abdominal pain, blood in stool, diarrhea, heartburn, melena, nausea and vomiting.       Rectal pain  Musculoskeletal: Negative for joint pain and myalgias.  Skin: Negative for rash.  Neurological: Negative for dizziness and weakness.  Endo/Heme/Allergies: Does not bruise/bleed easily.  Psychiatric/Behavioral: Negative for depression. The patient is not nervous/anxious.   All other systems reviewed and are negative.    Objective: BP 114/73   Pulse 68   Temp (!) 97.3 F (36.3 C) (Temporal)   Ht 5\' 7"  (1.702 m)   Wt 162 lb 9.6 oz (73.8 kg)   BMI 25.47 kg/m  Physical Exam Vitals and nursing note reviewed.  Constitutional:      General: He is not in acute distress.    Appearance: Normal appearance. He is normal weight. He is not ill-appearing, toxic-appearing or diaphoretic.  HENT:     Head: Normocephalic and atraumatic.     Nose: No congestion or rhinorrhea.  Eyes:     General: No scleral icterus. Cardiovascular:     Rate and Rhythm: Normal rate and regular rhythm.     Heart sounds: Normal heart sounds.  Pulmonary:     Effort: Pulmonary effort is normal.     Breath sounds: Normal breath sounds.  Abdominal:     General: Abdomen is flat. Bowel sounds are normal. There is no distension.     Palpations: Abdomen is soft. There is no hepatomegaly, splenomegaly or mass.     Tenderness: There is no abdominal tenderness. There is no guarding or  rebound.     Hernia: No hernia is present.  Genitourinary:    Rectum: Tenderness, anal fissure, external hemorrhoid and internal hemorrhoid present. No mass. Normal anal tone.  Musculoskeletal:     Cervical back: Neck supple.  Skin:    General: Skin is warm and dry.     Coloration: Skin is not jaundiced.     Findings: No bruising or rash.  Neurological:     General: No focal deficit present.     Mental Status: He is alert and oriented to person, place, and time. Mental status is at baseline.  Psychiatric:        Mood and Affect: Mood normal.        Behavior: Behavior normal.        Thought Content: Thought content normal.      Assessment:  Pleasant 45 year old male with a history of rectal pain, hemorrhoids, anal fissure presents for recurrence of rectal pain.  Denies hematochezia.  Colonoscopy  up-to-date approximately 8 years ago and reassuring.  No red flag/warning signs or symptoms.  Constipation: He denies hard stools but notes it does take him longer to have a bowel movement and he has significant straining.  His pain typically occurs after a bowel movement and lingers.  Not currently on any medications.  Rectal pain: Given his history, results of digital rectal exam, his exam today likely hemorrhoids and anal fissure recurrence worsened by constipation.  We will try to control his constipation and I will send in apothecary cream (Anusol with nifedipine, lidocaine).  He is to call us for any worsening symptoms.  No obvious bleeding noted.  Follow-up in 6 to 8 weeks and we can consider repeat colonoscopy if needed.  Hemorrhoids: Noted deflated hemorrhoid tag on external exam, appreciated likely 1-2 internal hemorrhoids not excessively enlarged.  He denies rectal bleeding at this time.  We can follow-up with [facility] if needed at follow-up.  Can consider colonoscopy for further evaluation depending on his clinical progress on recommended treatment.   Plan: 1. Increase water and fiber  intake 2. Colace daily 3. Apothecary cream with Anusol, nifedipine, lidocaine 4. Follow-up in 6 to 8 weeks 5. Consider repeat colonoscopy depending on clinical progress    Thank you for allowing [place] to participate in the care of Stephen [name]  Wynne Dust, DNP, AGNP-C Adult & Gerontological Nurse Practitioner [facility]   05/14/2020 8:48 AM   Disclaimer: This note was dictated with voice recognition software. Similar sounding words can inadvertently be transcribed and may not be corrected upon review.

## 2020-05-14 NOTE — Patient Instructions (Signed)
Your health issues we discussed today were:   Straining with stools: 1. Try to increase your water intake and your fiber intake 2. If you cannot consume enough fiber, you can consider adding a fiber supplement which is available over-the-counter 3. Start taking Colace stool softener 100 mg once a day to help you have less straining 4. Call if you see any bleeding or have any worsening symptoms  Hemorrhoids and likely anal fissure is likely source of rectal pain: 1. I will send in compounded rectal cream that will have a steroid component for your hemorrhoids, and anal fissure medication (diltiazem), and lidocaine for numbing 2. Call us for any worsening or severe symptoms  Overall I recommend:  1. Continue other current medications 2. Here for follow-up in 6 to 8 weeks 3. We can consider repeating colonoscopy at that time, if needed 4. Call if you have any questions or concerns   ---------------------------------------------------------------  At [facility] we value your feedback. You may receive a survey about your visit today. Please share your experience as we strive to create trusting relationships with our patients to provide genuine, compassionate, quality care.  We appreciate your understanding and patience as we review any laboratory studies, imaging, and other diagnostic tests that are ordered as we care for you. Our office policy is 5 business days for review of these results, and any emergent or urgent results are addressed in a timely manner for your best interest. If you do not hear from our office in 1 week, please contact us.   We also encourage the use of MyChart, which contains your medical information for your review as well. If you are not enrolled in this feature, an access code is on this after visit summary for your convenience. Thank you for allowing [place] to be involved in your care.  It was great to see you today!  I hope you have a safe and warm  winter!!     ---------------------------------------------------------------

## 2020-07-16 ENCOUNTER — Ambulatory Visit: Payer: BC Managed Care – PPO | Admitting: Nurse Practitioner

## 2020-09-10 ENCOUNTER — Other Ambulatory Visit: Payer: Self-pay | Admitting: Internal Medicine

## 2020-09-10 ENCOUNTER — Other Ambulatory Visit: Payer: Self-pay

## 2020-09-10 ENCOUNTER — Ambulatory Visit (HOSPITAL_COMMUNITY)
Admission: RE | Admit: 2020-09-10 | Discharge: 2020-09-10 | Disposition: A | Discharge status: Home / Self Care | Payer: BC Managed Care – PPO | Source: Ambulatory Visit | Attending: Internal Medicine | Admitting: Internal Medicine

## 2020-09-10 ENCOUNTER — Other Ambulatory Visit (HOSPITAL_COMMUNITY): Payer: Self-pay | Admitting: Internal Medicine

## 2020-09-10 DIAGNOSIS — H547 Unspecified visual loss: Secondary | ICD-10-CM | POA: Diagnosis not present

## 2020-09-10 DIAGNOSIS — G459 Transient cerebral ischemic attack, unspecified: Secondary | ICD-10-CM

## 2020-09-10 DIAGNOSIS — Z6826 Body mass index (BMI) 26.0-26.9, adult: Secondary | ICD-10-CM | POA: Diagnosis not present

## 2020-09-10 DIAGNOSIS — Z1331 Encounter for screening for depression: Secondary | ICD-10-CM | POA: Diagnosis not present

## 2020-09-10 DIAGNOSIS — G43109 Migraine with aura, not intractable, without status migrainosus: Secondary | ICD-10-CM | POA: Diagnosis not present

## 2020-09-10 DIAGNOSIS — E663 Overweight: Secondary | ICD-10-CM | POA: Diagnosis not present

## 2020-09-10 DIAGNOSIS — H538 Other visual disturbances: Secondary | ICD-10-CM | POA: Diagnosis not present

## 2020-10-14 DIAGNOSIS — H43393 Other vitreous opacities, bilateral: Secondary | ICD-10-CM | POA: Diagnosis not present

## 2020-11-03 DIAGNOSIS — D485 Neoplasm of uncertain behavior of skin: Secondary | ICD-10-CM | POA: Diagnosis not present

## 2020-11-03 DIAGNOSIS — L814 Other melanin hyperpigmentation: Secondary | ICD-10-CM | POA: Diagnosis not present

## 2020-11-03 DIAGNOSIS — D225 Melanocytic nevi of trunk: Secondary | ICD-10-CM | POA: Diagnosis not present

## 2020-12-05 DIAGNOSIS — R300 Dysuria: Secondary | ICD-10-CM | POA: Diagnosis not present

## 2020-12-05 DIAGNOSIS — N419 Inflammatory disease of prostate, unspecified: Secondary | ICD-10-CM | POA: Diagnosis not present

## 2020-12-05 DIAGNOSIS — E663 Overweight: Secondary | ICD-10-CM | POA: Diagnosis not present

## 2020-12-05 DIAGNOSIS — E782 Mixed hyperlipidemia: Secondary | ICD-10-CM | POA: Diagnosis not present

## 2020-12-05 DIAGNOSIS — Z0001 Encounter for general adult medical examination with abnormal findings: Secondary | ICD-10-CM | POA: Diagnosis not present

## 2020-12-05 DIAGNOSIS — E785 Hyperlipidemia, unspecified: Secondary | ICD-10-CM | POA: Diagnosis not present

## 2020-12-05 DIAGNOSIS — R7309 Other abnormal glucose: Secondary | ICD-10-CM | POA: Diagnosis not present

## 2020-12-05 DIAGNOSIS — Z6825 Body mass index (BMI) 25.0-25.9, adult: Secondary | ICD-10-CM | POA: Diagnosis not present

## 2020-12-05 DIAGNOSIS — K625 Hemorrhage of anus and rectum: Secondary | ICD-10-CM | POA: Diagnosis not present

## 2020-12-05 DIAGNOSIS — K648 Other hemorrhoids: Secondary | ICD-10-CM | POA: Diagnosis not present

## 2020-12-23 ENCOUNTER — Encounter: Payer: Self-pay | Admitting: Internal Medicine

## 2021-01-20 DIAGNOSIS — D225 Melanocytic nevi of trunk: Secondary | ICD-10-CM | POA: Diagnosis not present

## 2021-01-20 DIAGNOSIS — Q825 Congenital non-neoplastic nevus: Secondary | ICD-10-CM | POA: Diagnosis not present

## 2021-01-20 DIAGNOSIS — D485 Neoplasm of uncertain behavior of skin: Secondary | ICD-10-CM | POA: Diagnosis not present

## 2021-02-06 DIAGNOSIS — R1310 Dysphagia, unspecified: Secondary | ICD-10-CM | POA: Diagnosis not present

## 2021-02-06 DIAGNOSIS — E782 Mixed hyperlipidemia: Secondary | ICD-10-CM | POA: Diagnosis not present

## 2021-02-06 DIAGNOSIS — K219 Gastro-esophageal reflux disease without esophagitis: Secondary | ICD-10-CM | POA: Diagnosis not present

## 2021-02-06 DIAGNOSIS — Z6825 Body mass index (BMI) 25.0-25.9, adult: Secondary | ICD-10-CM | POA: Diagnosis not present

## 2021-02-06 DIAGNOSIS — E663 Overweight: Secondary | ICD-10-CM | POA: Diagnosis not present

## 2021-03-13 ENCOUNTER — Other Ambulatory Visit: Payer: Self-pay

## 2021-03-13 ENCOUNTER — Encounter: Payer: Self-pay | Admitting: Internal Medicine

## 2021-03-13 ENCOUNTER — Ambulatory Visit (INDEPENDENT_AMBULATORY_CARE_PROVIDER_SITE_OTHER): Payer: BC Managed Care – PPO | Admitting: Internal Medicine

## 2021-03-13 VITALS — BP 122/68 | HR 57 | Temp 97.1°F | Ht 67.0 in | Wt 167.4 lb

## 2021-03-13 DIAGNOSIS — R1319 Other dysphagia: Secondary | ICD-10-CM | POA: Diagnosis not present

## 2021-03-13 DIAGNOSIS — K6289 Other specified diseases of anus and rectum: Secondary | ICD-10-CM

## 2021-03-13 DIAGNOSIS — K219 Gastro-esophageal reflux disease without esophagitis: Secondary | ICD-10-CM

## 2021-03-13 DIAGNOSIS — K921 Melena: Secondary | ICD-10-CM | POA: Diagnosis not present

## 2021-03-13 MED ORDER — PANTOPRAZOLE SODIUM 40 MG PO TBEC
40.0000 mg | DELAYED_RELEASE_TABLET | Freq: Every day | ORAL | 11 refills | Status: DC
Start: 2021-03-13 — End: 2022-04-12

## 2021-03-13 NOTE — Progress Notes (Signed)
Primary Care Physician:  Elfredia Nevins, MD Primary Gastroenterologist:  Dr. Jena Gauss  Pre-Procedure History & Physical: HPI:  Stephen [name] is a 45 y.o. male here for their further evaluation of worsening GERD, intermittent esophageal dysphagia and occasional anorectal pain and paper hematochezia. Patient notes his heartburn has worsened over the past few months.  He has poor eating habits.  He eats fast.  He has noticed difficulty getting solid food down.  Feels like it sticks.  Started taking OTC Nexium with some improvement in reflux symptoms.  Readily admits to drinking at least 18 beers over the weekend with liquor on top of that.  No alcohol during the week.  He does note when he cuts back on alcohol his bowel symptoms settle down.  Has not had any nausea or vomiting.  He is lost 20 pounds recently to try to get his cholesterol under better control.  Had some internal hemorrhoids back on 2014 colonoscopy.  Was seen here earlier in the year for rectal pain felt to have a fissure.  He was given prescription hemorrhoid cream with nifedipine/Xylocaine.  States that helped to some degree.  Over the past 3 months, his anorectal symptoms have improved.  He denies constipation or diarrhea.  Has 1 bowel movement daily.  Positive family history of colon cancer in 2 maternal uncles.  No prior EGD.  He does not use tobacco.  Past Medical History:  Diagnosis Date   Elevated triglycerides with high cholesterol    Hemorrhoids     Past Surgical History:  Procedure Laterality Date   COLONOSCOPY  09/07/2005   RMR: Minimal internal hemorrhoids, otherwise normal rectum, normal appearing colonic mucosa, however poor prep compromised exam (MARGINAL PREP)   COLONOSCOPY N/A 08/17/2012   Procedure: COLONOSCOPY;  Surgeon: Corbin Ade, MD;  Location: [facility];  Service: Endoscopy;  Laterality: N/A;  10:45 AM-moved to [address] to notify pt   ROTATOR CUFF REPAIR     left    Prior to Admission  medications   Medication Sig Start Date End Date Taking? Authorizing Provider  esomeprazole (NEXIUM) 20 MG capsule Take 20 mg by mouth daily at 12 noon.   Yes [provider]    Allergies as of 03/13/2021 - Review Complete 03/13/2021  Allergen Reaction Noted   Penicillins Other (See Comments) 04/25/2012    Family History  Problem Relation Age of Onset   Pulmonary embolism Mother    Cancer Mother    Colon cancer Neg Hx     Social History   Socioeconomic History   Marital status: Married    Spouse name: Not on file   Number of children: 3   Years of education: Not on file   Highest education level: Not on file  Occupational History   Occupation: Customer service manager, Therapist, music care  Tobacco Use   Smoking status: Former    Packs/day: 0.20    Years: 18.00    Pack years: 3.60    Types: Cigarettes   Smokeless tobacco: Never  Substance and Sexual Activity   Alcohol use: Yes    Comment: 18 beers in a week, on weekends; 05/14/20 6 beers once a week   Drug use: No   Sexual activity: Not on file  Other Topics Concern   Not on file  Social History Narrative   Not on file   Social Determinants of Health   Financial Resource Strain: Not on file  Food Insecurity: Not on file  Transportation  Needs: Not on file  Physical Activity: Not on file  Stress: Not on file  Social Connections: Not on file  Intimate Partner Violence: Not on file    Review of Systems: See HPI, otherwise negative ROS  Physical Exam: BP 122/68   Pulse (!) 57   Temp (!) 97.1 F (36.2 C)   Ht 5\' 7"  (1.702 m)   Wt 167 lb 6.4 oz (75.9 kg)   BMI 26.22 kg/m  General:   Alert, pleasant and cooperative in NAD Neck:  Supple; no masses or thyromegaly. No significant cervical adenopathy. Lungs:  Clear throughout to auscultation.   No wheezes, crackles, or rhonchi. No acute distress. Heart:  Regular rate and rhythm; no murmurs, clicks, rubs,  or gallops. Abdomen: Non-distended, normal bowel sounds.   Soft and nontender without appreciable mass or hepatosplenomegaly.  Pulses:  Normal pulses noted. Extremities:  Without clubbing or edema. Rectal: Deferred until the time of colonoscopy.   Impression/Plan: Pleasant 45 year old gentleman resents with worsening GERD symptoms now esophageal dysphagia in the setting of reported binge alcohol consumption.  He is also had issues with intermittent pain in his anorectum with paper hematochezia.  Treated empirically for an anal fissure previously.  He does report the symptoms have subsided over the past 3 months. He really does not have much in the way of constipation or diarrhea.  I suspect binge alcohol consumption may be temporally related to his anorectal symptoms.  Patient is also made the observation this may be the case. Poor eating habits and alcohol consumption also likely contributing to his poorly controlled reflux.  Some improvement with OTC PPI therapy recently.  Further evaluation of his dysphagia and rectal bleeding warranted.  He needs better control of reflux and lifestyle modification.  Recommendations:  Stop Nexium  Begin Protonix 40 mg once daily 30 minutes before breakfast (new prescription - dispense 30 with 11 refills)  We will schedule an EGD with esophageal dilation (GERD and esophageal dysphagia) as well as a diagnostic colonoscopy (hematochezia).  ASA 3/propofol in the near future.  The risks, benefits, limitations, alternatives and imponderables have been reviewed with the patient. Questions have been answered. All parties are agreeable.    It is most important to stop consumption of beer and liquor.  Avoiding alcohol in the form of beer, liquor, wine, etc. will be of great benefit in helping acid reflux symptoms as well as bowel issues.  Further recommendations to follow after we have performed the upper endoscopy and colonoscopy..      Notice: This dictation was prepared with Dragon dictation along with smaller  phrase technology. Any transcriptional errors that result from this process are unintentional and may not be corrected upon review.

## 2021-03-13 NOTE — Patient Instructions (Signed)
It was good to see you again today!  Stop Nexium  Begin Protonix 40 mg once daily 30 minutes before breakfast (new prescription - dispense 30 with 11 refills)  We will schedule an EGD with esophageal dilation (GERD and esophageal dysphagia) as well as a diagnostic colonoscopy (hematochezia).  ASA 3/propofol in the near future  The most important thing I can tell you today is to ideally stop consumption of beer and liquor.  Avoiding alcohol in the form of beer, liquor, wine, etc. will be of great benefit in helping your acid reflux symptoms as well as bowel issues.  Further recommendations to follow after we have performed the upper endoscopy and colonoscopy.Marland Kitchen

## 2021-03-13 NOTE — H&P (View-Only) (Signed)
  Primary Care Physician:  Fusco, Lawrence, MD Primary Gastroenterologist:  Dr. Nayali Talerico  Pre-Procedure History & Physical: HPI:  Stephen [name] is a 45 y.o. male here for their further evaluation of worsening GERD, intermittent esophageal dysphagia and occasional anorectal pain and paper hematochezia. Patient notes his heartburn has worsened over the past few months.  He has poor eating habits.  He eats fast.  He has noticed difficulty getting solid food down.  Feels like it sticks.  Started taking OTC Nexium with some improvement in reflux symptoms.  Readily admits to drinking at least 18 beers over the weekend with liquor on top of that.  No alcohol during the week.  He does note when he cuts back on alcohol his bowel symptoms settle down.  Has not had any nausea or vomiting.  He is lost 20 pounds recently to try to get his cholesterol under better control.  Had some internal hemorrhoids back on 2014 colonoscopy.  Was seen here earlier in the year for rectal pain felt to have a fissure.  He was given prescription hemorrhoid cream with nifedipine/Xylocaine.  States that helped to some degree.  Over the past 3 months, his anorectal symptoms have improved.  He denies constipation or diarrhea.  Has 1 bowel movement daily.  Positive family history of colon cancer in 2 maternal uncles.  No prior EGD.  He does not use tobacco.  Past Medical History:  Diagnosis Date   Elevated triglycerides with high cholesterol    Hemorrhoids     Past Surgical History:  Procedure Laterality Date   COLONOSCOPY  09/07/2005   RMR: Minimal internal hemorrhoids, otherwise normal rectum, normal appearing colonic mucosa, however poor prep compromised exam (MARGINAL PREP)   COLONOSCOPY N/A 08/17/2012   Procedure: COLONOSCOPY;  Surgeon: Akshitha Culmer M Olivine Hiers, MD;  Location: [facility];  Service: Endoscopy;  Laterality: N/A;  10:45 AM-moved to [address] to notify pt   ROTATOR CUFF REPAIR     left    Prior to Admission  medications   Medication Sig Start Date End Date Taking? Authorizing Provider  esomeprazole (NEXIUM) 20 MG capsule Take 20 mg by mouth daily at 12 noon.   Yes [provider]    Allergies as of 03/13/2021 - Review Complete 03/13/2021  Allergen Reaction Noted   Penicillins Other (See Comments) 04/25/2012    Family History  Problem Relation Age of Onset   Pulmonary embolism Mother    Cancer Mother    Colon cancer Neg Hx     Social History   Socioeconomic History   Marital status: Married    Spouse name: Not on file   Number of children: 3   Years of education: Not on file   Highest education level: Not on file  Occupational History   Occupation: Press Room Coordinator, Lawn care  Tobacco Use   Smoking status: Former    Packs/day: 0.20    Years: 18.00    Pack years: 3.60    Types: Cigarettes   Smokeless tobacco: Never  Substance and Sexual Activity   Alcohol use: Yes    Comment: 18 beers in a week, on weekends; 05/14/20 6 beers once a week   Drug use: No   Sexual activity: Not on file  Other Topics Concern   Not on file  Social History Narrative   Not on file   Social Determinants of Health   Financial Resource Strain: Not on file  Food Insecurity: Not on file  Transportation   Needs: Not on file  Physical Activity: Not on file  Stress: Not on file  Social Connections: Not on file  Intimate Partner Violence: Not on file    Review of Systems: See HPI, otherwise negative ROS  Physical Exam: BP 122/68    Pulse (!) 57    Temp (!) 97.1 F (36.2 C)    Ht 5\' 7"  (1.702 m)    Wt 167 lb 6.4 oz (75.9 kg)    BMI 26.22 kg/m  General:   Alert, pleasant and cooperative in NAD Neck:  Supple; no masses or thyromegaly. No significant cervical adenopathy. Lungs:  Clear throughout to auscultation.   No wheezes, crackles, or rhonchi. No acute distress. Heart:  Regular rate and rhythm; no murmurs, clicks, rubs,  or gallops. Abdomen: Non-distended, normal bowel sounds.   Soft and nontender without appreciable mass or hepatosplenomegaly.  Pulses:  Normal pulses noted. Extremities:  Without clubbing or edema. Rectal: Deferred until the time of colonoscopy.   Impression/Plan: Pleasant 45 year old gentleman resents with worsening GERD symptoms now esophageal dysphagia in the setting of reported binge alcohol consumption.  He is also had issues with intermittent pain in his anorectum with paper hematochezia.  Treated empirically for an anal fissure previously.  He does report the symptoms have subsided over the past 3 months. He really does not have much in the way of constipation or diarrhea.  I suspect binge alcohol consumption may be temporally related to his anorectal symptoms.  Patient is also made the observation this may be the case. Poor eating habits and alcohol consumption also likely contributing to his poorly controlled reflux.  Some improvement with OTC PPI therapy recently.  Further evaluation of his dysphagia and rectal bleeding warranted.  He needs better control of reflux and lifestyle modification.  Recommendations:  Stop Nexium  Begin Protonix 40 mg once daily 30 minutes before breakfast (new prescription - dispense 30 with 11 refills)  We will schedule an EGD with esophageal dilation (GERD and esophageal dysphagia) as well as a diagnostic colonoscopy (hematochezia).  ASA 3/propofol in the near future.  The risks, benefits, limitations, alternatives and imponderables have been reviewed with the patient. Questions have been answered. All parties are agreeable.    It is most important to stop consumption of beer and liquor.  Avoiding alcohol in the form of beer, liquor, wine, etc. will be of great benefit in helping acid reflux symptoms as well as bowel issues.  Further recommendations to follow after we have performed the upper endoscopy and colonoscopy..      Notice: This dictation was prepared with Dragon dictation along with smaller  phrase technology. Any transcriptional errors that result from this process are unintentional and may not be corrected upon review.

## 2021-03-16 ENCOUNTER — Telehealth: Payer: Self-pay | Admitting: Internal Medicine

## 2021-03-16 NOTE — Telephone Encounter (Signed)
ERROR

## 2021-03-19 ENCOUNTER — Telehealth: Payer: Self-pay | Admitting: Internal Medicine

## 2021-03-19 ENCOUNTER — Telehealth: Payer: Self-pay

## 2021-03-19 ENCOUNTER — Other Ambulatory Visit: Payer: Self-pay

## 2021-03-19 MED ORDER — PEG 3350-KCL-NA BICARB-NACL 420 G PO SOLR: 4000.0000 mL | ORAL | 0 refills | Status: AC

## 2021-03-19 NOTE — Telephone Encounter (Signed)
Spoke to pt, TCS/EGD/DIL scheduled for 03/26/21 at 10:30am. Rx for prep sent to pharmacy. Orders entered. Instructions sent to his MyChart.

## 2021-03-19 NOTE — Telephone Encounter (Signed)
Tried to call pt to schedule TCS/EGD w/Propofol ASA 3 w/Dr. Rourk, LMOVM for return call. 

## 2021-03-19 NOTE — Telephone Encounter (Signed)
Patient returned call, said he was at work and would call after he gets off at 3

## 2021-03-19 NOTE — Telephone Encounter (Signed)
Called pt, informed him of pre-op appt 03/24/21 at 8:30am. Appt letter sent to MyChart.

## 2021-03-23 NOTE — Patient Instructions (Signed)
Stephen [name]  03/23/2021     @PREFPERIOPPHARMACY @   Your procedure is scheduled on  03/26/2021.   Report to 03/28/2021 at  0845  A.M.   Call this number if you have problems the morning of surgery:  [phone]   Remember:  Follow the diet and prep instructions given to you by the office.    Take these medicines the morning of surgery with A SIP OF WATER                                                 Protonix.     Do not wear jewelry, make-up or nail polish.  Do not wear lotions, powders, or perfumes, or deodorant.  Do not shave 48 hours prior to surgery.  Men may shave face and neck.  Do not bring valuables to the hospital.  [facility] is not responsible for any belongings or valuables.  Contacts, dentures or bridgework may not be worn into surgery.  Leave your suitcase in the car.  After surgery it may be brought to your room.  For patients admitted to the hospital, discharge time will be determined by your treatment team.  Patients discharged the day of surgery will not be allowed to drive home and must have someone with them for 24 hours.    Special instructions:   DO NOT smoke tobacco or vape for 24 hours before your procedure.  Please read over the following fact sheets that you were given. Anesthesia Post-op Instructions and Care and Recovery After Surgery      Upper Endoscopy, Adult, Care After This sheet gives you information about how to care for yourself after your procedure. Your health care provider may also give you more specific instructions. If you have problems or questions, contact your health care provider. What can I expect after the procedure? After the procedure, it is common to have: A sore throat. Mild stomach pain or discomfort. Bloating. Nausea. Follow these instructions at home:  Follow instructions from your health care provider about what to eat or drink after your procedure. Return to your normal activities as  told by your health care provider. Ask your health care provider what activities are safe for you. Take over-the-counter and prescription medicines only as told by your health care provider. If you were given a sedative during the procedure, it can affect you for several hours. Do not drive or operate machinery until your health care provider says that it is safe. Keep all follow-up visits as told by your health care provider. This is important. Contact a health care provider if you have: A sore throat that lasts longer than one day. Trouble swallowing. Get help right away if: You vomit blood or your vomit looks like coffee grounds. You have: A fever. Bloody, black, or tarry stools. A severe sore throat or you cannot swallow. Difficulty breathing. Severe pain in your chest or abdomen. Summary After the procedure, it is common to have a sore throat, mild stomach discomfort, bloating, and nausea. If you were given a sedative during the procedure, it can affect you for several hours. Do not drive or operate machinery until your health care provider says that it is safe. Follow instructions from your health care provider about what to eat or drink after your  procedure. Return to your normal activities as told by your health care provider. This information is not intended to replace advice given to you by your health care provider. Make sure you discuss any questions you have with your health care provider. Document Revised: 01/26/2019 Document Reviewed: 08/22/2017 Elsevier Patient Education  2022 [employer] Esophageal Dilatation Esophageal dilatation, also called esophageal dilation, is a procedure to widen or open a blocked or narrowed part of the esophagus. The esophagus is the part of the body that moves food and liquid from the mouth to the stomach. You may need this procedure if: You have a buildup of scar tissue in your esophagus that makes it difficult, painful, or impossible to  swallow. This can be caused by gastroesophageal reflux disease (GERD). You have cancer of the esophagus. There is a problem with how food moves through your esophagus. In some cases, you may need this procedure repeated at a later time to dilate the esophagus gradually. Tell a health care provider about: Any allergies you have. All medicines you are taking, including vitamins, herbs, eye drops, creams, and over-the-counter medicines. Any problems you or family members have had with anesthetic medicines. Any blood disorders you have. Any surgeries you have had. Any medical conditions you have. Any antibiotic medicines you are required to take before dental procedures. Whether you are pregnant or may be pregnant. What are the risks? Generally, this is a safe procedure. However, problems may occur, including: Bleeding due to a tear in the lining of the esophagus. A hole, or perforation, in the esophagus. What happens before the procedure? Ask your health care provider about: Changing or stopping your regular medicines. This is especially important if you are taking diabetes medicines or blood thinners. Taking medicines such as aspirin and ibuprofen. These medicines can thin your blood. Do not take these medicines unless your health care provider tells you to take them. Taking over-the-counter medicines, vitamins, herbs, and supplements. Follow instructions from your health care provider about eating or drinking restrictions. Plan to have a responsible adult take you home from the hospital or clinic. Plan to have a responsible adult care for you for the time you are told after you leave the hospital or clinic. This is important. What happens during the procedure? You may be given a medicine to help you relax (sedative). A numbing medicine may be sprayed into the back of your throat, or you may gargle the medicine. Your health care provider may perform the dilatation using various surgical  instruments, such as: Simple dilators. This instrument is carefully placed in the esophagus to stretch it. Guided wire bougies. This involves using an endoscope to insert a wire into the esophagus. A dilator is passed over this wire to enlarge the esophagus. Then the wire is removed. Balloon dilators. An endoscope with a small balloon is inserted into the esophagus. The balloon is inflated to stretch the esophagus and open it up. The procedure may vary among health care providers and hospitals. What can I expect after the procedure? Your blood pressure, heart rate, breathing rate, and blood oxygen level will be monitored until you leave the hospital or clinic. Your throat may feel slightly sore and numb. This will get better over time. You will not be allowed to eat or drink until your throat is no longer numb. When you are able to drink, urinate, and sit on the edge of the bed without nausea or dizziness, you may be able to return home. Follow these instructions  at home: Take over-the-counter and prescription medicines only as told by your health care provider. If you were given a sedative during the procedure, it can affect you for several hours. Do not drive or operate machinery until your health care provider says that it is safe. Plan to have a responsible adult care for you for the time you are told. This is important. Follow instructions from your health care provider about any eating or drinking restrictions. Do not use any products that contain nicotine or tobacco, such as cigarettes, e-cigarettes, and chewing tobacco. If you need help quitting, ask your health care provider. Keep all follow-up visits. This is important. Contact a health care provider if: You have a fever. You have pain that is not relieved by medicine. Get help right away if: You have chest pain. You have trouble breathing. You have trouble swallowing. You vomit blood. You have black, tarry, or bloody  stools. These symptoms may represent a serious problem that is an emergency. Do not wait to see if the symptoms will go away. Get medical help right away. Call your local emergency services (911 in the [place] Do not drive yourself to the hospital. Summary Esophageal dilatation, also called esophageal dilation, is a procedure to widen or open a blocked or narrowed part of the esophagus. Plan to have a responsible adult take you home from the hospital or clinic. For this procedure, a numbing medicine may be sprayed into the back of your throat, or you may gargle the medicine. Do not drive or operate machinery until your health care provider says that it is safe. This information is not intended to replace advice given to you by your health care provider. Make sure you discuss any questions you have with your health care provider. Document Revised: 08/08/2019 Document Reviewed: 08/08/2019 Elsevier Patient Education  2022 [employer]. Colonoscopy, Adult, Care After This sheet gives you information about how to care for yourself after your procedure. Your health care provider may also give you more specific instructions. If you have problems or questions, contact your health care provider. What can I expect after the procedure? After the procedure, it is common to have: A small amount of blood in your stool for 24 hours after the procedure. Some gas. Mild cramping or bloating of your abdomen. Follow these instructions at home: Eating and drinking  Drink enough fluid to keep your urine pale yellow. Follow instructions from your health care provider about eating or drinking restrictions. Resume your normal diet as instructed by your health care provider. Avoid heavy or fried foods that are hard to digest. Activity Rest as told by your health care provider. Avoid sitting for a long time without moving. Get up to take short walks every 1-2 hours. This is important to improve blood flow and  breathing. Ask for help if you feel weak or unsteady. Return to your normal activities as told by your health care provider. Ask your health care provider what activities are safe for you. Managing cramping and bloating  Try walking around when you have cramps or feel bloated. Apply heat to your abdomen as told by your health care provider. Use the heat source that your health care provider recommends, such as a moist heat pack or a heating pad. Place a towel between your skin and the heat source. Leave the heat on for 20-30 minutes. Remove the heat if your skin turns bright red. This is especially important if you are unable to feel pain, heat, or  cold. You may have a greater risk of getting burned. General instructions If you were given a sedative during the procedure, it can affect you for several hours. Do not drive or operate machinery until your health care provider says that it is safe. For the first 24 hours after the procedure: Do not sign important documents. Do not drink alcohol. Do your regular daily activities at a slower pace than normal. Eat soft foods that are easy to digest. Take over-the-counter and prescription medicines only as told by your health care provider. Keep all follow-up visits as told by your health care provider. This is important. Contact a health care provider if: You have blood in your stool 2-3 days after the procedure. Get help right away if you have: More than a small spotting of blood in your stool. Large blood clots in your stool. Swelling of your abdomen. Nausea or vomiting. A fever. Increasing pain in your abdomen that is not relieved with medicine. Summary After the procedure, it is common to have a small amount of blood in your stool. You may also have mild cramping and bloating of your abdomen. If you were given a sedative during the procedure, it can affect you for several hours. Do not drive or operate machinery until your health care  provider says that it is safe. Get help right away if you have a lot of blood in your stool, nausea or vomiting, a fever, or increased pain in your abdomen. This information is not intended to replace advice given to you by your health care provider. Make sure you discuss any questions you have with your health care provider. Document Revised: 01/26/2019 Document Reviewed: 10/16/2018 Elsevier Patient Education  2022 Else[employer] Inc. Monitored Anesthesia Care, Care After This sheet gives you information about how to care for yourself after your procedure. Your health care provider may also give you more specific instructions. If you have problems or questions, contact your health care provider. What can I expect after the procedure? After the procedure, it is common to have: Tiredness. Forgetfulness about what happened after the procedure. Impaired judgment for important decisions. Nausea or vomiting. Some difficulty with balance. Follow these instructions at home: For the time period you were told by your health care provider:   Rest as needed. Do not participate in activities where you could fall or become injured. Do not drive or use machinery. Do not drink alcohol. Do not take sleeping pills or medicines that cause drowsiness. Do not make important decisions or sign legal documents. Do not take care of children on your own. Eating and drinking Follow the diet that is recommended by your health care provider. Drink enough fluid to keep your urine pale yellow. If you vomit: Drink water, juice, or soup when you can drink without vomiting. Make sure you have little or no nausea before eating solid foods. General instructions Have a responsible adult stay with you for the time you are told. It is important to have someone help care for you until you are awake and alert. Take over-the-counter and prescription medicines only as told by your health care provider. If you have sleep apnea,  surgery and certain medicines can increase your risk for breathing problems. Follow instructions from your health care provider about wearing your sleep device: Anytime you are sleeping, including during daytime naps. While taking prescription pain medicines, sleeping medicines, or medicines that make you drowsy. Avoid smoking. Keep all follow-up visits as told by your health care provider. This  is important. Contact a health care provider if: You keep feeling nauseous or you keep vomiting. You feel light-headed. You are still sleepy or having trouble with balance after 24 hours. You develop a rash. You have a fever. You have redness or swelling around the IV site. Get help right away if: You have trouble breathing. You have new-onset confusion at home. Summary For several hours after your procedure, you may feel tired. You may also be forgetful and have poor judgment. Have a responsible adult stay with you for the time you are told. It is important to have someone help care for you until you are awake and alert. Rest as told. Do not drive or operate machinery. Do not drink alcohol or take sleeping pills. Get help right away if you have trouble breathing, or if you suddenly become confused. This information is not intended to replace advice given to you by your health care provider. Make sure you discuss any questions you have with your health care provider. Document Revised: 12/06/2019 Document Reviewed: 02/22/2019 Elsevier Patient Education  2022 [employer].

## 2021-03-24 ENCOUNTER — Other Ambulatory Visit: Payer: Self-pay

## 2021-03-24 ENCOUNTER — Encounter (HOSPITAL_COMMUNITY)
Admission: RE | Admit: 2021-03-24 | Discharge: 2021-03-24 | Disposition: A | Discharge status: Home / Self Care | Payer: BC Managed Care – PPO | Source: Ambulatory Visit | Attending: Internal Medicine | Admitting: Internal Medicine

## 2021-03-24 ENCOUNTER — Encounter (HOSPITAL_COMMUNITY): Payer: Self-pay

## 2021-03-24 VITALS — BP 124/70 | HR 61 | Temp 97.1°F | Resp 16 | Ht 67.0 in | Wt 167.4 lb

## 2021-03-24 DIAGNOSIS — K640 First degree hemorrhoids: Secondary | ICD-10-CM | POA: Diagnosis not present

## 2021-03-24 DIAGNOSIS — R1310 Dysphagia, unspecified: Secondary | ICD-10-CM | POA: Diagnosis not present

## 2021-03-24 DIAGNOSIS — F1090 Alcohol use, unspecified, uncomplicated: Secondary | ICD-10-CM | POA: Insufficient documentation

## 2021-03-24 DIAGNOSIS — K921 Melena: Secondary | ICD-10-CM | POA: Diagnosis not present

## 2021-03-24 DIAGNOSIS — Z01818 Encounter for other preprocedural examination: Secondary | ICD-10-CM | POA: Insufficient documentation

## 2021-03-24 DIAGNOSIS — K222 Esophageal obstruction: Secondary | ICD-10-CM | POA: Diagnosis not present

## 2021-03-24 HISTORY — DX: Gastro-esophageal reflux disease without esophagitis: K21.9

## 2021-03-24 LAB — COMPREHENSIVE METABOLIC PANEL
ALT: 20 U/L (ref 0–44)
AST: 18 U/L (ref 15–41)
Albumin: 4.2 g/dL (ref 3.5–5.0)
Alkaline Phosphatase: 49 U/L (ref 38–126)
Anion gap: 7 (ref 5–15)
BUN: 17 mg/dL (ref 6–20)
CO2: 27 mmol/L (ref 22–32)
Calcium: 9.1 mg/dL (ref 8.9–10.3)
Chloride: 105 mmol/L (ref 98–111)
Creatinine, Ser: 0.93 mg/dL (ref 0.61–1.24)
GFR, Estimated: >60 mL/min (ref >60)
Glucose, Bld: 110 mg/dL — ABNORMAL HIGH (ref 70–99)
Potassium: 3.7 mmol/L (ref 3.5–5.1)
Sodium: 139 mmol/L (ref 135–145)
Total Bilirubin: 1 mg/dL (ref 0.3–1.2)
Total Protein: 6.8 g/dL (ref 6.5–8.1)

## 2021-03-26 ENCOUNTER — Ambulatory Visit (HOSPITAL_COMMUNITY)
Admission: RE | Admit: 2021-03-26 | Discharge: 2021-03-26 | Disposition: A | Discharge status: Home / Self Care | Payer: BC Managed Care – PPO | Source: Ambulatory Visit | Attending: Internal Medicine | Admitting: Internal Medicine

## 2021-03-26 ENCOUNTER — Encounter (HOSPITAL_COMMUNITY)
Admission: RE | Disposition: A | Discharge status: Home / Self Care | Payer: Self-pay | Source: Ambulatory Visit | Attending: Internal Medicine

## 2021-03-26 ENCOUNTER — Ambulatory Visit (HOSPITAL_COMMUNITY): Payer: BC Managed Care – PPO | Admitting: Anesthesiology

## 2021-03-26 DIAGNOSIS — K222 Esophageal obstruction: Secondary | ICD-10-CM | POA: Insufficient documentation

## 2021-03-26 DIAGNOSIS — K640 First degree hemorrhoids: Secondary | ICD-10-CM | POA: Diagnosis not present

## 2021-03-26 DIAGNOSIS — K921 Melena: Secondary | ICD-10-CM | POA: Diagnosis not present

## 2021-03-26 DIAGNOSIS — K219 Gastro-esophageal reflux disease without esophagitis: Secondary | ICD-10-CM | POA: Diagnosis not present

## 2021-03-26 DIAGNOSIS — R1310 Dysphagia, unspecified: Secondary | ICD-10-CM | POA: Insufficient documentation

## 2021-03-26 HISTORY — PX: MALONEY DILATION: SHX5535

## 2021-03-26 HISTORY — PX: ESOPHAGOGASTRODUODENOSCOPY (EGD) WITH PROPOFOL: SHX5813

## 2021-03-26 HISTORY — PX: COLONOSCOPY WITH PROPOFOL: SHX5780

## 2021-03-26 SURGERY — COLONOSCOPY WITH PROPOFOL
Anesthesia: General

## 2021-03-26 MED ORDER — LACTATED RINGERS IV SOLN: INTRAVENOUS | Status: DC

## 2021-03-26 MED ORDER — PROPOFOL 10 MG/ML IV BOLUS
INTRAVENOUS | Status: DC | PRN
  Administered 2021-03-26: 50 mg via INTRAVENOUS
  Administered 2021-03-26: 100 mg via INTRAVENOUS
  Administered 2021-03-26 (×2): 50 mg via INTRAVENOUS
  Administered 2021-03-26: 150 mg via INTRAVENOUS
  Administered 2021-03-26: 50 mg via INTRAVENOUS

## 2021-03-26 MED ORDER — LIDOCAINE HCL (CARDIAC) PF 50 MG/5ML IV SOSY
PREFILLED_SYRINGE | INTRAVENOUS | Status: DC | PRN
  Administered 2021-03-26: 100 mg via INTRAVENOUS

## 2021-03-26 NOTE — Op Note (Signed)
[facility] Patient Name: Stephen [name] Procedure Date: 03/26/2021 10:29 AM MRN: [id number] Date of Birth: May 14, 1975 Attending MD: Gennette Pac , MD CSN: [id number] Age: 45 Admit Type: Outpatient Procedure:                Colonoscopy Indications:              Hematochezia Providers:                Gennette Pac, MD, Angelica Ran, Kristine L.                            Jessee Avers, Technician Referring MD:              Medicines:                Propofol per Anesthesia Complications:            No immediate complications. Estimated Blood Loss:     Estimated blood loss: none. Procedure:                Pre-Anesthesia Assessment:                           - Prior to the procedure, a History and Physical                            was performed, and patient medications and                            allergies were reviewed. The patient's tolerance of                            previous anesthesia was also reviewed. The risks                            and benefits of the procedure and the sedation                            options and risks were discussed with the patient.                            All questions were answered, and informed consent                            was obtained. Prior Anticoagulants: The patient has                            taken no previous anticoagulant or antiplatelet                            agents. ASA Grade Assessment: III - A patient with                            severe systemic disease. After reviewing the risks  and benefits, the patient was deemed in                            satisfactory condition to undergo the procedure.                           After obtaining informed consent, the colonoscope                            was passed under direct vision. Throughout the                            procedure, the patient's blood pressure, pulse, and                            oxygen saturations were  monitored continuously. The                            [id number]) scope was introduced through                            the anus and advanced to the the cecum, identified                            by appendiceal orifice and ileocecal valve. The                            colonoscopy was performed without difficulty. The                            patient tolerated the procedure well. The quality                            of the bowel preparation was adequate. Scope In: 10:33:01 AM Scope Out: 10:43:36 AM Scope Withdrawal Time: 0 hours 6 minutes 15 seconds  Total Procedure Duration: 0 hours 10 minutes 35 seconds  Findings:      The perianal and digital rectal examinations were normal.      Non-bleeding internal hemorrhoids were found during retroflexion. The       hemorrhoids were mild, small and Grade I (internal hemorrhoids that do       not prolapse).      The exam was otherwise without abnormality on direct and retroflexion       views. Impression:               - Non-bleeding internal hemorrhoids.                           - The examination was otherwise normal on direct                            and retroflexion views.                           - No specimens collected. Moderate Sedation:      Moderate (conscious)  sedation was personally administered by an       anesthesia professional. The following parameters were monitored: oxygen       saturation, heart rate, blood pressure, respiratory rate, EKG, adequacy       of pulmonary ventilation, and response to care. Recommendation:           - Patient has a contact number available for                            emergencies. The signs and symptoms of potential                            delayed complications were discussed with the                            patient. Return to normal activities tomorrow.                            Written discharge instructions were provided to the                            patient.                            - Resume previous diet.                           - Continue present medications. Continue to curtail                            beer consumption.                           - Repeat colonoscopy in 10 years for screening                            purposes. [facility] [place] [facility] cream and                            nitroglycerin and Xylocaine. Apply a pea-sized                            amount to the anorectum 3 times daily as needed.                            See EGD report Procedure Code(s):        --- Professional ---                           [phone], Colonoscopy, flexible; diagnostic, including                            collection of specimen(s) by brushing or washing,                            when performed (separate procedure) Diagnosis Code(s):        ---  Professional ---                           K64.0, First degree hemorrhoids                           K92.1, Melena (includes Hematochezia) CPT copyright 2019 American Medical Association. All rights reserved. The codes documented in this report are preliminary and upon coder review may  be revised to meet current compliance requirements. Gerrit Friends. Sylwia Cuervo, MD Gennette Pac, MD 03/26/2021 10:59:00 AM This report has been signed electronically. Number of Addenda: 0

## 2021-03-26 NOTE — Interval H&P Note (Signed)
History and Physical Interval Note:  03/26/2021 9:55 AM  Stephen [name]  has presented today for surgery, with the diagnosis of GERD, esophageal dysphagia, hematochezia.  The various methods of treatment have been discussed with the patient and family. After consideration of risks, benefits and other options for treatment, the patient has consented to  Procedure(s) with comments: COLONOSCOPY WITH PROPOFOL (N/A) - 10:30am ESOPHAGOGASTRODUODENOSCOPY (EGD) WITH PROPOFOL (N/A) MALONEY DILATION (N/A) as a surgical intervention.  The patient's history has been reviewed, patient examined, no change in status, stable for surgery.  I have reviewed the patient's chart and labs.  Questions were answered to the patient's satisfaction.     Steven Basso   No change.   he stopped drinking beer entirely at my request.  So for,  anorectal symptoms improved.  Also Protonix once daily seems to be helping his reflux symptoms considerably.  EGD with ED as feasible/appropriate and diagnostic colonoscopy today per plan.   The risks, benefits, limitations, imponderables and alternatives regarding both EGD and colonoscopy have been reviewed with the patient. Questions have been answered. All parties agreeable.

## 2021-03-26 NOTE — Transfer of Care (Signed)
Immediate Anesthesia Transfer of Care Note  Patient: Shooter Tangen Imm  Procedure(s) Performed: COLONOSCOPY WITH PROPOFOL ESOPHAGOGASTRODUODENOSCOPY (EGD) WITH PROPOFOL MALONEY DILATION  Patient Location: Short Stay  Anesthesia Type:General  Level of Consciousness: sedated  Airway & Oxygen Therapy: Patient Spontanous Breathing  Post-op Assessment: Report given to RN and Post -op Vital signs reviewed and stable  Post vital signs: Reviewed and stable  Last Vitals:  Vitals Value Taken Time  BP 95/50 03/26/21 1049  Temp 36.7 C 03/26/21 1049  Pulse 79 03/26/21 1049  Resp 16 03/26/21 1049  SpO2 97 % 03/26/21 1049    Last Pain:  Vitals:   03/26/21 1049  TempSrc: Oral  PainSc: 0-No pain      Patients Stated Pain Goal: 5 (03/26/21 0856)  Complications: No notable events documented.

## 2021-03-26 NOTE — Op Note (Signed)
[facility] Patient Name: Stephen [name] Procedure Date: 03/26/2021 9:55 AM MRN: [id number] Date of Birth: May 04, 1975 Attending MD: Gennette Pac , MD CSN: [id number] Age: 45 Admit Type: Outpatient Procedure:                Upper GI endoscopy Indications:              Dysphagia Providers:                Gennette Pac, MD, Angelica Ran, Kristine L.                            Jessee Avers, Technician Referring MD:              Medicines:                Propofol per Anesthesia Complications:            No immediate complications. Estimated Blood Loss:     Estimated blood loss was minimal. Procedure:                Pre-Anesthesia Assessment:                           - Prior to the procedure, a History and Physical                            was performed, and patient medications and                            allergies were reviewed. The patient's tolerance of                            previous anesthesia was also reviewed. The risks                            and benefits of the procedure and the sedation                            options and risks were discussed with the patient.                            All questions were answered, and informed consent                            was obtained. Prior Anticoagulants: The patient has                            taken no previous anticoagulant or antiplatelet                            agents. ASA Grade Assessment: III - A patient with                            severe systemic disease. After reviewing the risks  and benefits, the patient was deemed in                            satisfactory condition to undergo the procedure.                           After obtaining informed consent, the endoscope was                            passed under direct vision. Throughout the                            procedure, the patient's blood pressure, pulse, and                            oxygen saturations  were monitored continuously. The                            GIF-H190 ([id number]) scope was introduced through the                            mouth, and advanced to the second part of duodenum.                            The upper GI endoscopy was accomplished without                            difficulty. The patient tolerated the procedure                            well. Scope In: 10:09:05 AM Scope Out: 10:27:19 AM Total Procedure Duration: 0 hours 18 minutes 14 seconds  Findings:      A mild Schatzki ring was found at the gastroesophageal junction. No       tumor or Barrett's epithelium seen. No esophagitis.      The entire examined stomach was normal.      The duodenal bulb and second portion of the duodenum were normal. The       scope was withdrawn. Dilation was performed with a Maloney dilator with       mild resistance at 56 Fr. The dilation site was examined following       endoscope reinsertion and showed no change. Estimated blood loss: none.       The scope was withdrawn. Dilation was performed with a Maloney dilator       with moderate resistance at 58 Fr. The dilation site was examined       following endoscope reinsertion and showed moderate mucosal disruption.       Estimated blood loss was minimal. Impression:               - Mild Schatzki ring. Dilated.                           - Normal stomach.                           -  Normal duodenal bulb and second portion of the                            duodenum.                           - No specimens collected. Moderate Sedation:      Moderate (conscious) sedation was personally administered by an       anesthesia professional. The following parameters were monitored: oxygen       saturation, heart rate, blood pressure, respiratory rate, EKG, adequacy       of pulmonary ventilation, and response to care. Recommendation:           - Patient has a contact number available for                            emergencies. The signs  and symptoms of potential                            delayed complications were discussed with the                            patient. Return to normal activities tomorrow.                            Written discharge instructions were provided to the                            patient.                           - Resume previous diet.                           - Continue present medications. Continue Protonix                            40 mg daily. See colonoscopy report                           - Return to my office in 6 months. Procedure Code(s):        --- Professional ---                           [phone], Esophagogastroduodenoscopy, flexible,                            transoral; diagnostic, including collection of                            specimen(s) by brushing or washing, when performed                            (separate procedure)                           43450, Dilation of esophagus,  by unguided sound or                            bougie, single or multiple passes Diagnosis Code(s):        --- Professional ---                           K22.2, Esophageal obstruction                           R13.10, Dysphagia, unspecified CPT copyright 2019 American Medical Association. All rights reserved. The codes documented in this report are preliminary and upon coder review may  be revised to meet current compliance requirements. Gerrit Friends. Secily Walthour, MD Gennette Pac, MD 03/26/2021 10:47:18 AM This report has been signed electronically. Number of Addenda: 0

## 2021-03-26 NOTE — Anesthesia Postprocedure Evaluation (Signed)
Anesthesia Post Note  Patient: Stephen [name]  Procedure(s) Performed: COLONOSCOPY WITH PROPOFOL ESOPHAGOGASTRODUODENOSCOPY (EGD) WITH PROPOFOL MALONEY DILATION  Patient location during evaluation: Phase II Anesthesia Type: General Level of consciousness: awake and alert and oriented Pain management: pain level controlled Vital Signs Assessment: post-procedure vital signs reviewed and stable Respiratory status: spontaneous breathing, nonlabored ventilation and respiratory function stable Cardiovascular status: blood pressure returned to baseline and stable Postop Assessment: no apparent nausea or vomiting Anesthetic complications: no   No notable events documented.   Last Vitals:  Vitals:   03/26/21 0856 03/26/21 1049  BP: 121/69 (!) 95/50  Pulse: 79 79  Resp: 18 16  Temp: 36.9 C 36.7 C  SpO2: 100% 97%    Last Pain:  Vitals:   03/26/21 1049  TempSrc: Oral  PainSc: 0-No pain                 Kaisha Wachob C Jolly Carlini

## 2021-03-26 NOTE — Discharge Instructions (Signed)
Colonoscopy Discharge Instructions  Read the instructions outlined below and refer to this sheet in the next few weeks. These discharge instructions provide you with general information on caring for yourself after you leave the hospital. Your doctor may also give you specific instructions. While your treatment has been planned according to the most current medical practices available, unavoidable complications occasionally occur. If you have any problems or questions after discharge, call Dr. Gala Romney at [phone]. ACTIVITY You may resume your regular activity, but move at a slower pace for the next 24 hours.  Take frequent rest periods for the next 24 hours.  Walking will help get rid of the air and reduce the bloated feeling in your belly (abdomen).  No driving for 24 hours (because of the medicine (anesthesia) used during the test).   Do not sign any important legal documents or operate any machinery for 24 hours (because of the anesthesia used during the test).  NUTRITION Drink plenty of fluids.  You may resume your normal diet as instructed by your doctor.  Begin with a light meal and progress to your normal diet. Heavy or fried foods are harder to digest and may make you feel sick to your stomach (nauseated).  Avoid alcoholic beverages for 24 hours or as instructed.  MEDICATIONS You may resume your normal medications unless your doctor tells you otherwise.  WHAT YOU CAN EXPECT TODAY Some feelings of bloating in the abdomen.  Passage of more gas than usual.  Spotting of blood in your stool or on the toilet paper.  IF YOU HAD POLYPS REMOVED DURING THE COLONOSCOPY: No aspirin products for 7 days or as instructed.  No alcohol for 7 days or as instructed.  Eat a soft diet for the next 24 hours.  FINDING OUT THE RESULTS OF YOUR TEST Not all test results are available during your visit. If your test results are not back during the visit, make an appointment with your caregiver to find out the  results. Do not assume everything is normal if you have not heard from your caregiver or the medical facility. It is important for you to follow up on all of your test results.  SEEK IMMEDIATE MEDICAL ATTENTION IF: You have more than a spotting of blood in your stool.  Your belly is swollen (abdominal distention).  You are nauseated or vomiting.  You have a temperature over 101.  You have abdominal pain or discomfort that is severe or gets worse throughout the day.   EGD Discharge instructions Please read the instructions outlined below and refer to this sheet in the next few weeks. These discharge instructions provide you with general information on caring for yourself after you leave the hospital. Your doctor may also give you specific instructions. While your treatment has been planned according to the most current medical practices available, unavoidable complications occasionally occur. If you have any problems or questions after discharge, please call your doctor. ACTIVITY You may resume your regular activity but move at a slower pace for the next 24 hours.  Take frequent rest periods for the next 24 hours.  Walking will help expel (get rid of) the air and reduce the bloated feeling in your abdomen.  No driving for 24 hours (because of the anesthesia (medicine) used during the test).  You may shower.  Do not sign any important legal documents or operate any machinery for 24 hours (because of the anesthesia used during the test).  NUTRITION Drink plenty of fluids.  You may  resume your normal diet.  Begin with a light meal and progress to your normal diet.  Avoid alcoholic beverages for 24 hours or as instructed by your caregiver.  MEDICATIONS You may resume your normal medications unless your caregiver tells you otherwise.  WHAT YOU CAN EXPECT TODAY You may experience abdominal discomfort such as a feeling of fullness or gas pains.  FOLLOW-UP Your doctor will discuss the results of  your test with you.  SEEK IMMEDIATE MEDICAL ATTENTION IF ANY OF THE FOLLOWING OCCUR: Excessive nausea (feeling sick to your stomach) and/or vomiting.  Severe abdominal pain and distention (swelling).  Trouble swallowing.  Temperature over 101 F (37.8 C).  Rectal bleeding or vomiting of blood.    Your esophagus was dilated today.  Continue taking Protonix 40 mg every day before breakfast  GERD information provided  Your colonoscopy was normal except for hemorrhoids  Utilize [facility] hemorrhoid cream (add nitroglycerin and Xylocaine).  Pea-sized amount to the anorectum 3 times a day as needed for anorectal discomfort  It is recommended you return for repeat colonoscopy for screening purposes in 10 years  Continue to limit the intake of alcohol-this will help your entire GI tract  Office visit with me in 6 months  Patient request, I called [facility] at [phone] -reviewed findings and recommendations

## 2021-03-26 NOTE — Anesthesia Procedure Notes (Signed)
Date/Time: 03/26/2021 10:02 AM Performed by: Franco Nones, CRNA Pre-anesthesia Checklist: Patient identified, Emergency Drugs available, Suction available, Timeout performed and Patient being monitored Patient Re-evaluated:Patient Re-evaluated prior to induction Oxygen Delivery Method: Nasal Cannula

## 2021-03-26 NOTE — Anesthesia Preprocedure Evaluation (Signed)
Anesthesia Evaluation  Patient identified by MRN, date of birth, ID band Patient awake    Reviewed: Allergy & Precautions, NPO status , Patient's Chart, lab work & pertinent test results  History of Anesthesia Complications Negative for: history of anesthetic complications  Airway        Dental  (+) Dental Advisory Given   Pulmonary former smoker,    Pulmonary exam normal breath sounds clear to auscultation       Cardiovascular Exercise Tolerance: Good Normal cardiovascular exam Rhythm:Regular Rate:Normal     Neuro/Psych negative neurological ROS  negative psych ROS   GI/Hepatic GERD  Medicated,(+)     substance abuse  alcohol use,   Endo/Other  negative endocrine ROS  Renal/GU negative Renal ROS     Musculoskeletal negative musculoskeletal ROS (+)   Abdominal   Peds  Hematology negative hematology ROS (+)   Anesthesia Other Findings   Reproductive/Obstetrics negative OB ROS                           Anesthesia Physical Anesthesia Plan  ASA: 2  Anesthesia Plan: General   Post-op Pain Management:    Induction: Intravenous  PONV Risk Score and Plan: TIVA  Airway Management Planned: Nasal Cannula and Natural Airway  Additional Equipment:   Intra-op Plan:   Post-operative Plan:   Informed Consent: I have reviewed the patients History and Physical, chart, labs and discussed the procedure including the risks, benefits and alternatives for the proposed anesthesia with the patient or authorized representative who has indicated his/her understanding and acceptance.     Dental advisory given  Plan Discussed with: CRNA and Surgeon  Anesthesia Plan Comments:         Anesthesia Quick Evaluation

## 2021-03-31 ENCOUNTER — Encounter (HOSPITAL_COMMUNITY): Payer: Self-pay | Admitting: Internal Medicine

## 2021-04-29 DIAGNOSIS — J329 Chronic sinusitis, unspecified: Secondary | ICD-10-CM | POA: Diagnosis not present

## 2021-04-29 DIAGNOSIS — Z6825 Body mass index (BMI) 25.0-25.9, adult: Secondary | ICD-10-CM | POA: Diagnosis not present

## 2021-04-29 DIAGNOSIS — E663 Overweight: Secondary | ICD-10-CM | POA: Diagnosis not present

## 2021-04-29 DIAGNOSIS — K219 Gastro-esophageal reflux disease without esophagitis: Secondary | ICD-10-CM | POA: Diagnosis not present

## 2021-05-12 ENCOUNTER — Ambulatory Visit: Payer: BC Managed Care – PPO | Admitting: Gastroenterology

## 2021-07-14 ENCOUNTER — Encounter: Payer: Self-pay | Admitting: Podiatry

## 2021-07-14 ENCOUNTER — Telehealth: Payer: Self-pay | Admitting: Podiatry

## 2021-07-14 ENCOUNTER — Ambulatory Visit (INDEPENDENT_AMBULATORY_CARE_PROVIDER_SITE_OTHER): Payer: BC Managed Care – PPO

## 2021-07-14 ENCOUNTER — Ambulatory Visit: Payer: BC Managed Care – PPO | Admitting: Podiatry

## 2021-07-14 DIAGNOSIS — M2022 Hallux rigidus, left foot: Secondary | ICD-10-CM

## 2021-07-14 DIAGNOSIS — M2021 Hallux rigidus, right foot: Secondary | ICD-10-CM

## 2021-07-14 DIAGNOSIS — M7751 Other enthesopathy of right foot: Secondary | ICD-10-CM | POA: Diagnosis not present

## 2021-07-14 DIAGNOSIS — M7752 Other enthesopathy of left foot: Secondary | ICD-10-CM

## 2021-07-14 NOTE — Progress Notes (Signed)
Subjective:   Patient ID: Stephen [name], male   DOB: 46 y.o.   MRN: [id number]   HPI 46 year old male presents the office today with concerns of arthritis to both of his first [place].  He was last seen by Dr. Gershon Mussel for this.  The right tendon seems to hurt more than left.  He started after wearing steel toed shoes at work which aggravates his symptoms.  When he wears his work shoes he does not have significant pain to his foot.  He is still able to be active and around with his kids without severe pain.  He did try orthotics previously which were not helpful.  He has no other concerns.   Review of Systems  All other systems reviewed and are negative.  Past Medical History:  Diagnosis Date   Elevated triglycerides with high cholesterol    GERD (gastroesophageal reflux disease)    Hemorrhoids     Past Surgical History:  Procedure Laterality Date   COLONOSCOPY  09/07/2005   RMR: Minimal internal hemorrhoids, otherwise normal rectum, normal appearing colonic mucosa, however poor prep compromised exam (MARGINAL PREP)   COLONOSCOPY N/A 08/17/2012   Procedure: COLONOSCOPY;  Surgeon: Daneil Dolin, MD;  [facility];  Service: Endoscopy;  Laterality: N/A;  10:45 AM-moved to [place] to notify pt   COLONOSCOPY WITH PROPOFOL N/A 03/26/2021   Procedure: COLONOSCOPY WITH PROPOFOL;  Surgeon: Daneil Dolin, MD;  [facility];  Service: Endoscopy;  Laterality: N/A;  10:30am   ESOPHAGOGASTRODUODENOSCOPY (EGD) WITH PROPOFOL N/A 03/26/2021   Procedure: ESOPHAGOGASTRODUODENOSCOPY (EGD) WITH PROPOFOL;  Surgeon: Daneil Dolin, MD;  [facility];  Service: Endoscopy;  Laterality: N/A;   MALONEY DILATION N/A 03/26/2021   Procedure: Venia Minks DILATION;  Surgeon: Daneil Dolin, MD;  [facility];  Service: Endoscopy;  Laterality: N/A;   ROTATOR CUFF REPAIR     left     Current Outpatient Medications:    pantoprazole (PROTONIX) 40 MG tablet, Take 1 tablet  (40 mg total) by mouth daily., Disp: 30 tablet, Rfl: 11   polyethylene glycol-electrolytes (TRILYTE) 420 g solution, Take 4,000 mLs by mouth as directed., Disp: 4000 mL, Rfl: 0  Allergies  Allergen Reactions   Penicillins Other (See Comments)    Does not know          Objective:  Physical Exam  General: AAO x3, NAD  Dermatological: Skin is warm, dry and supple bilateral. There are no open sores, no preulcerative lesions, no rash or signs of infection present.  Vascular: Dorsalis Pedis artery and Posterior Tibial artery pedal pulses are 2/4 bilateral with immedate capillary fill time. There is no pain with calf compression, swelling, warmth, erythema.   Neruologic: Grossly intact via light touch bilateral.   Musculoskeletal: Decreased range of motion of first MPJ.  Crepitation noted on the right side worse than left.  Minimal edema to the MPJs but there is no erythema or warmth.  No other areas of discomfort noted.  Muscular strength 5/5 in all groups tested bilateral.  Gait: Unassisted, Nonantalgic.       Assessment:   Hallux rigidus bilaterally, capsulitis first MPJ     Plan:  -Treatment options discussed including all alternatives, risks, and complications -Etiology of symptoms were discussed -X-rays were obtained and reviewed with the patient.  3 views obtained and reviewed of bilateral feet.  Arthritic changes present to bilateral first MPJs.  No evidence of acute fracture. -Discussed both conservative  as well as surgical measures.  Conservatively he has tried different shoes and wearing appropriate seems to be beneficial.  A note will be provided for him to wear this at work as opposed to his steel toed shoes. -Surgically discussed first imaging arthrodesis.  He is not interested in fusion at this time.  He did bring up the implant which I do not think would be beneficial for him at this point.  Trula Slade DPM

## 2021-07-14 NOTE — Telephone Encounter (Signed)
Patient said you gave him a note to refrain from steel toe shoes and wear sneakers to work. His job said they need him to complete accommodation paperwork. How long will this accommodation need to be in place. ?

## 2021-07-15 DIAGNOSIS — M2021 Hallux rigidus, right foot: Secondary | ICD-10-CM | POA: Insufficient documentation

## 2021-09-02 ENCOUNTER — Encounter: Payer: Self-pay | Admitting: Internal Medicine

## 2021-09-07 DIAGNOSIS — R079 Chest pain, unspecified: Secondary | ICD-10-CM | POA: Diagnosis not present

## 2021-09-07 DIAGNOSIS — K219 Gastro-esophageal reflux disease without esophagitis: Secondary | ICD-10-CM | POA: Diagnosis not present

## 2021-12-08 DIAGNOSIS — Z0000 Encounter for general adult medical examination without abnormal findings: Secondary | ICD-10-CM | POA: Diagnosis not present

## 2021-12-08 DIAGNOSIS — E782 Mixed hyperlipidemia: Secondary | ICD-10-CM | POA: Diagnosis not present

## 2021-12-08 DIAGNOSIS — E663 Overweight: Secondary | ICD-10-CM | POA: Diagnosis not present

## 2021-12-08 DIAGNOSIS — Z1331 Encounter for screening for depression: Secondary | ICD-10-CM | POA: Diagnosis not present

## 2021-12-08 DIAGNOSIS — Z6825 Body mass index (BMI) 25.0-25.9, adult: Secondary | ICD-10-CM | POA: Diagnosis not present

## 2021-12-08 DIAGNOSIS — K219 Gastro-esophageal reflux disease without esophagitis: Secondary | ICD-10-CM | POA: Diagnosis not present

## 2021-12-09 DIAGNOSIS — E782 Mixed hyperlipidemia: Secondary | ICD-10-CM | POA: Diagnosis not present

## 2021-12-09 DIAGNOSIS — Z0000 Encounter for general adult medical examination without abnormal findings: Secondary | ICD-10-CM | POA: Diagnosis not present

## 2021-12-09 DIAGNOSIS — E663 Overweight: Secondary | ICD-10-CM | POA: Diagnosis not present

## 2022-01-14 DIAGNOSIS — M19012 Primary osteoarthritis, left shoulder: Secondary | ICD-10-CM | POA: Diagnosis not present

## 2022-01-14 DIAGNOSIS — M5451 Vertebrogenic low back pain: Secondary | ICD-10-CM | POA: Diagnosis not present

## 2022-04-09 ENCOUNTER — Other Ambulatory Visit: Payer: Self-pay | Admitting: Internal Medicine

## 2022-04-09 DIAGNOSIS — R1319 Other dysphagia: Secondary | ICD-10-CM

## 2022-04-09 DIAGNOSIS — K219 Gastro-esophageal reflux disease without esophagitis: Secondary | ICD-10-CM

## 2022-07-06 DIAGNOSIS — D485 Neoplasm of uncertain behavior of skin: Secondary | ICD-10-CM | POA: Diagnosis not present

## 2022-07-06 DIAGNOSIS — L570 Actinic keratosis: Secondary | ICD-10-CM | POA: Diagnosis not present

## 2022-07-16 ENCOUNTER — Telehealth: Payer: Self-pay | Admitting: Gastroenterology

## 2022-07-16 NOTE — Telephone Encounter (Signed)
Hi Dr. Barron Alvine,  Supervising Provider PM 07/16/2022  Patient's wife called to request a transfer of care over to [facility]. Patient has history with [facility]. Requesting a transfer of care due to not being happy with the care that was provided and he is still having consistent problems with blood in stools and abdominal pain. Also, Patient's Mother in [place] is a patient with [place] and referred patient to [place]. Records are in [facility] for you to review and advise on scheduling.   Thank you.

## 2022-07-20 ENCOUNTER — Encounter: Payer: Self-pay | Admitting: Gastroenterology

## 2022-09-07 DIAGNOSIS — Z6825 Body mass index (BMI) 25.0-25.9, adult: Secondary | ICD-10-CM | POA: Diagnosis not present

## 2022-09-07 DIAGNOSIS — E663 Overweight: Secondary | ICD-10-CM | POA: Diagnosis not present

## 2022-09-07 DIAGNOSIS — R490 Dysphonia: Secondary | ICD-10-CM | POA: Diagnosis not present

## 2022-09-07 DIAGNOSIS — J329 Chronic sinusitis, unspecified: Secondary | ICD-10-CM | POA: Diagnosis not present

## 2022-10-05 ENCOUNTER — Ambulatory Visit (INDEPENDENT_AMBULATORY_CARE_PROVIDER_SITE_OTHER): Payer: BC Managed Care – PPO | Admitting: Gastroenterology

## 2022-10-05 ENCOUNTER — Other Ambulatory Visit (INDEPENDENT_AMBULATORY_CARE_PROVIDER_SITE_OTHER): Payer: BC Managed Care – PPO

## 2022-10-05 ENCOUNTER — Encounter: Payer: Self-pay | Admitting: Gastroenterology

## 2022-10-05 VITALS — BP 112/70 | HR 86 | Ht 67.0 in | Wt 169.0 lb

## 2022-10-05 DIAGNOSIS — K921 Melena: Secondary | ICD-10-CM | POA: Diagnosis not present

## 2022-10-05 DIAGNOSIS — K648 Other hemorrhoids: Secondary | ICD-10-CM

## 2022-10-05 DIAGNOSIS — K602 Anal fissure, unspecified: Secondary | ICD-10-CM

## 2022-10-05 DIAGNOSIS — K219 Gastro-esophageal reflux disease without esophagitis: Secondary | ICD-10-CM

## 2022-10-05 DIAGNOSIS — R1319 Other dysphagia: Secondary | ICD-10-CM

## 2022-10-05 LAB — BASIC METABOLIC PANEL
BUN: 14 mg/dL (ref 6–23)
CO2: 29 mEq/L (ref 19–32)
Calcium: 9.2 mg/dL (ref 8.4–10.5)
Chloride: 105 mEq/L (ref 96–112)
Creatinine, Ser: 1.08 mg/dL (ref 0.40–1.50)
GFR: 81.26 mL/min (ref >60.00)
Glucose, Bld: 69 mg/dL — ABNORMAL LOW (ref 70–99)
Potassium: 4 mEq/L (ref 3.5–5.1)
Sodium: 141 mEq/L (ref 135–145)

## 2022-10-05 LAB — VITAMIN B12: Vitamin B-12: 254 pg/mL (ref 211–911)

## 2022-10-05 LAB — IBC + FERRITIN
Ferritin: 93.7 ng/mL (ref 22.0–322.0)
Iron: 83 ug/dL (ref 42–165)
Saturation Ratios: 26 % (ref 20.0–50.0)
TIBC: 319.2 ug/dL (ref 250.0–450.0)
Transferrin: 228 mg/dL (ref 212.0–360.0)

## 2022-10-05 LAB — FOLATE: Folate: 11.7 ng/mL (ref >5.9)

## 2022-10-05 LAB — VITAMIN D 25 HYDROXY (VIT D DEFICIENCY, FRACTURES): VITD: 45.27 ng/mL (ref 30.00–100.00)

## 2022-10-05 MED ORDER — PANTOPRAZOLE SODIUM 40 MG PO TBEC: 40.0000 mg | DELAYED_RELEASE_TABLET | Freq: Every day | ORAL | 5 refills | Status: DC

## 2022-10-05 NOTE — Progress Notes (Signed)
Chief Complaint: Hematochezia, GERD    HPI:     Stephen [name] is a 47 y.o. male referred to the Gastroenterology Clinic for evaluation of hematochezia and GERD.  Previously followed with Dr. Jena Gauss at [facility], last seen in the office on 03/13/2021 for ongoing management of worsening GERD, intermittent dysphagia, and occasional anorectal pain and hematochezia.  Had stopped Nexium at that time and started Protonix 40 mg daily and schedule EGD with dilation along with colonoscopy as outlined below.  He presents today to transfer his GI care to [facility].  He reports his reflux is generally well-controlled when taking pantoprazole 40 mg daily, but he reports he is now taking it sporadically, then will take daily if he has a flare of reflux symptoms.  Index symptoms of regurgitation, heartburn, dyspepsia.  Patient requesting refill today.   Additionally, was having intermittent painless hematochezia for several months. Last episode was about 2 weeks ago. Described as BRB on tissue paper.   Has been told he has fissures and hemorrhoids in the past. Has had rectal pain with BM that eventually resolves, years ago.   Family history notable for 2 maternal uncles with colon cancer.  No recent labs or abdominal imaging for review.  Endoscopic History: - 08/17/2012: Colonoscopy: Internal hemorrhoids, suspected anal fissure.  Otherwise normal colon. Treated with nifedipine/Xylocaine -03/26/2021: EGD: Mild Schatzki's ring dilated with 56 [place] Maloney then 72 [place] with moderate mucosal disruption. Normal stomach, normal duodenum - 03/26/2021: Colonoscopy: Small grade 1 internal hemorrhoids, otherwise normal.  Repeat in 10 years.  Treated with topical NTG and Xylocaine    Past Medical History:  Diagnosis Date   Elevated triglycerides with high cholesterol    GERD (gastroesophageal reflux disease)    Hemorrhoids      Past Surgical History:  Procedure Laterality Date    COLONOSCOPY  09/07/2005   RMR: Minimal internal hemorrhoids, otherwise normal rectum, normal appearing colonic mucosa, however poor prep compromised exam (MARGINAL PREP)   COLONOSCOPY N/A 08/17/2012   Procedure: COLONOSCOPY;  Surgeon: Corbin Ade, MD;  [facility];  Service: Endoscopy;  Laterality: N/A;  10:45 AM-moved to [address] to notify pt   COLONOSCOPY WITH PROPOFOL N/A 03/26/2021   Procedure: COLONOSCOPY WITH PROPOFOL;  Surgeon: Corbin Ade, MD;  [facility];  Service: Endoscopy;  Laterality: N/A;  10:30am   ESOPHAGOGASTRODUODENOSCOPY (EGD) WITH PROPOFOL N/A 03/26/2021   Procedure: ESOPHAGOGASTRODUODENOSCOPY (EGD) WITH PROPOFOL;  Surgeon: Corbin Ade, MD;  [facility];  Service: Endoscopy;  Laterality: N/A;   MALONEY DILATION N/A 03/26/2021   Procedure: Elease Hashimoto DILATION;  Surgeon: Corbin Ade, MD;  [facility];  Service: Endoscopy;  Laterality: N/A;   ROTATOR CUFF REPAIR     left   Family History  Problem Relation Age of Onset   Pulmonary embolism Mother    Cancer Mother    Colon cancer Neg Hx    Social History   Tobacco Use   Smoking status: Never   Smokeless tobacco: Never  Substance Use Topics   Alcohol use: Yes    Comment: 18 beers in a week, on weekends; 05/14/20 6 beers once a week   Drug use: No   Current Outpatient Medications  Medication Sig Dispense Refill   pantoprazole (PROTONIX) 40 MG tablet TAKE ONE TABLET (40MG  TOTAL) BY MOUTH DAILY 30 tablet 5   polyethylene glycol-electrolytes (TRILYTE) 420  g solution Take 4,000 mLs by mouth as directed. 4000 mL 0   No current facility-administered medications for this visit.   Allergies  Allergen Reactions   Penicillins Other (See Comments)    Does not know      Review of Systems: All systems reviewed and negative except where noted in HPI.     Physical Exam:    Wt Readings from Last 3 Encounters:  10/05/22 169 lb (76.7 kg)  03/24/21 167  lb 6.4 oz (75.9 kg)  03/13/21 167 lb 6.4 oz (75.9 kg)    BP 112/70   Pulse 86   Ht 5\' 7"  (1.702 m)   Wt 169 lb (76.7 kg)   BMI 26.47 kg/m  Constitutional:  Pleasant, in no acute distress. Psychiatric: Normal mood and affect. Behavior is normal. Abdominal: Soft, nondistended. Neurological: Alert and oriented to person place and time. Skin: Skin is warm and dry. No rashes noted. Rectal exam: Sensation intact and preserved anal wink.  Has what appears to be a healed anal fissure in 9 o'clock position.  Small grade 1 internal hemorrhoids on anoscopy.  No internal anal fissure noted.   Normal sphincter tone. No palpable mass. No blood on the exam glove. (Chaperone: Ailene Rud, CMA).   ASSESSMENT AND PLAN;   1) GERD - Recommend that he take pantoprazole 40 mg daily rather than on demand given frequency of symptoms.  If well-controlled at 40 mg daily, can potentially decrease to 20 mg daily - Rx for pantoprazole 40 mg daily #90, RF5 - Given chronic PPI use, check BMP, iron panel, B12, folate, Vit D  2) Hematochezia 3) Internal hemorrhoids Small grade 1 internal hemorrhoids on exam and what appears to be a previous well-healed anal fissure.  Recommend conservative management. - Fiber supplement such as Benefiber or Citrucel - If return of symptoms, plan for sitz bath's, RectiCare OTC, and follow-up in the GI clinic - If continued bothersome hemorrhoidal symptoms, can consider hemorrhoid banding   RTC in 1 year or sooner prn     Shellia Cleverly, DO, FACG  10/05/2022, 8:19 AM   Elfredia Nevins, MD

## 2022-10-05 NOTE — Patient Instructions (Addendum)
Your provider has requested that you go to the basement level for lab work before leaving today. Press "B" on the elevator. The lab is located at the first door on the left as you exit the elevator.   We have sent the following medications to your pharmacy for you to pick up at your convenience: Protonix  Take Fiber Supplement daily.  If return of fissure symptoms, try Sitz bath and over the counter Recticare.  Follow up in 1 year. _______________________________________________________  If your blood pressure at your visit was 140/90 or greater, please contact your primary care physician to follow up on this.  _______________________________________________________  If you are age 48 or younger, your body mass index should be between 19-25. Your Body mass index is 26.47 kg/m. If this is out of the aformentioned range listed, please consider follow up with your Primary Care Provider.   __________________________________________________________  The [facility] providers would like to encourage you to use [facility] to communicate with providers for non-urgent requests or questions.  Due to long hold times on the telephone, sending your provider a message by [facility] may be a faster and more efficient way to get a response.  Please allow 48 business hours for a response.  Please remember that this is for non-urgent requests.   Due to recent changes in healthcare laws, you may see the results of your imaging and laboratory studies on MyChart before your provider has had a chance to review them.  We understand that in some cases there may be results that are confusing or concerning to you. Not all laboratory results come back in the same time frame and the provider may be waiting for multiple results in order to interpret others.  Please give [place] 48 hours in order for your provider to thoroughly review all the results before contacting the office for clarification of your results.     Thank you for  choosing me and [facility].  Vito Cirigliano, D.O.

## 2022-12-31 DIAGNOSIS — F5101 Primary insomnia: Secondary | ICD-10-CM | POA: Diagnosis not present

## 2022-12-31 DIAGNOSIS — R490 Dysphonia: Secondary | ICD-10-CM | POA: Diagnosis not present

## 2022-12-31 DIAGNOSIS — Z1331 Encounter for screening for depression: Secondary | ICD-10-CM | POA: Diagnosis not present

## 2022-12-31 DIAGNOSIS — E663 Overweight: Secondary | ICD-10-CM | POA: Diagnosis not present

## 2022-12-31 DIAGNOSIS — Z0000 Encounter for general adult medical examination without abnormal findings: Secondary | ICD-10-CM | POA: Diagnosis not present

## 2022-12-31 DIAGNOSIS — K219 Gastro-esophageal reflux disease without esophagitis: Secondary | ICD-10-CM | POA: Diagnosis not present

## 2022-12-31 DIAGNOSIS — Z6825 Body mass index (BMI) 25.0-25.9, adult: Secondary | ICD-10-CM | POA: Diagnosis not present

## 2023-03-09 DIAGNOSIS — E663 Overweight: Secondary | ICD-10-CM | POA: Diagnosis not present

## 2023-03-09 DIAGNOSIS — Z6826 Body mass index (BMI) 26.0-26.9, adult: Secondary | ICD-10-CM | POA: Diagnosis not present

## 2023-03-09 DIAGNOSIS — J069 Acute upper respiratory infection, unspecified: Secondary | ICD-10-CM | POA: Diagnosis not present

## 2023-04-05 DIAGNOSIS — H43393 Other vitreous opacities, bilateral: Secondary | ICD-10-CM | POA: Diagnosis not present

## 2023-05-20 IMAGING — MR MR HEAD W/O CM
12 series · 48 of 48 positions shown · non-contrast
Comparison: None.

CLINICAL DATA: Blurred vision over the last 30 minutes.

EXAM:
MRI HEAD WITHOUT CONTRAST
TECHNIQUE: Multiplanar, multiecho pulse sequences of the brain and surrounding
structures were obtained without intravenous contrast.

[Series 5: DWI · axial · 3.0mm · 0.77mm/px · z∈[-36,+111]mm · 3 of 50 slices shown (1 of 4)]
[im 1/50]
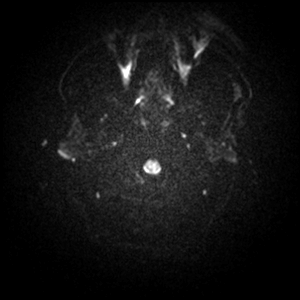
[im 25/50]
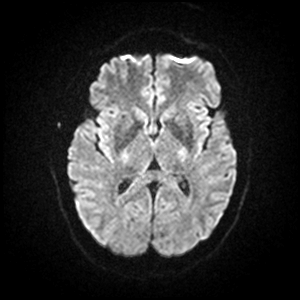
[im 50/50]
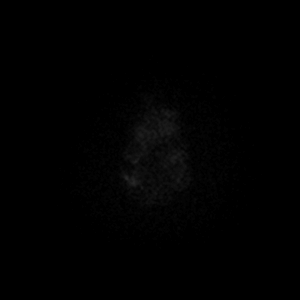

[Series 6: DWI · axial · 3.0mm · 0.77mm/px · z∈[-36,+111]mm · 4 of 50 slices shown (2 of 4)]
[im 1/50]
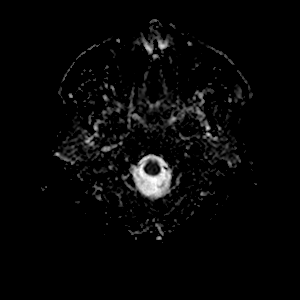
[im 17/50]
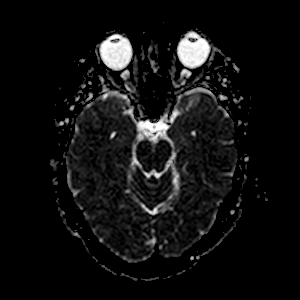
[im 33/50]
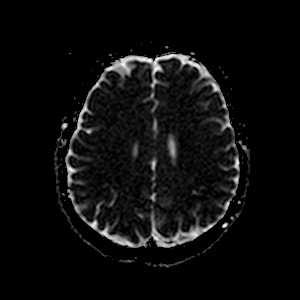
[im 50/50]
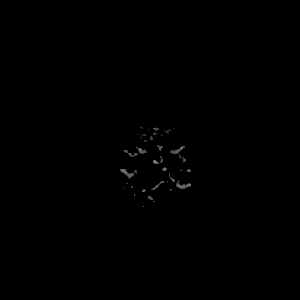

[Series 7: DWI · coronal · 5.0mm · 0.88mm/px · 2 of 28 slices shown (3 of 4)]
[im 1/28]
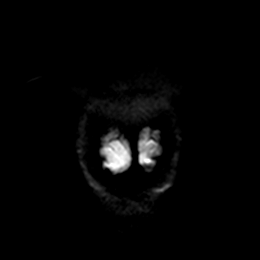
[im 28/28]
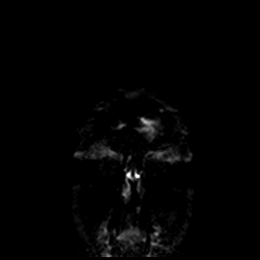

[Series 8: DWI · coronal · 5.0mm · 0.88mm/px · 2 of 28 slices shown (4 of 4)]
[im 1/28]
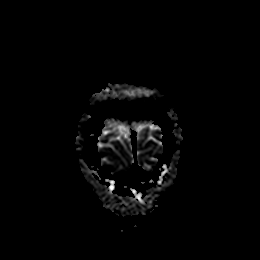
[im 28/28]
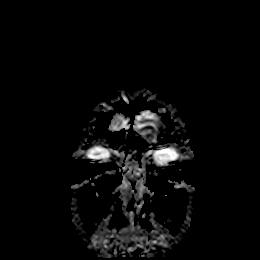

[Series 9: T1 · sagittal · 5.0mm · 0.75mm/px · 2 of 21 slices shown (1 of 2)]
[im 1/21]
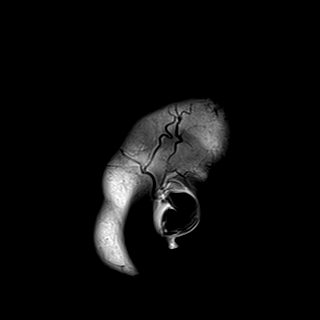
[im 21/21]
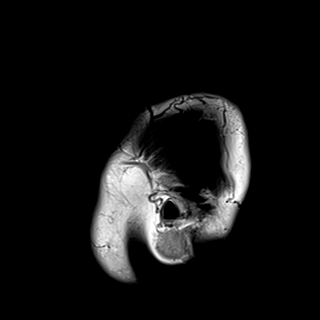

[Series 10: T2 · axial · 5.0mm · 0.72mm/px · z∈[-40,+114]mm · 2 of 23 slices shown (1 of 2)]
[im 1/23]
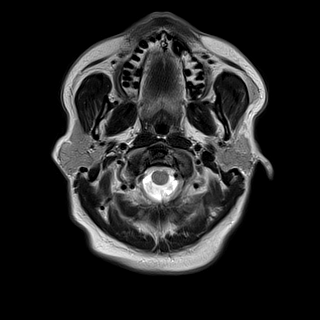
[im 23/23]
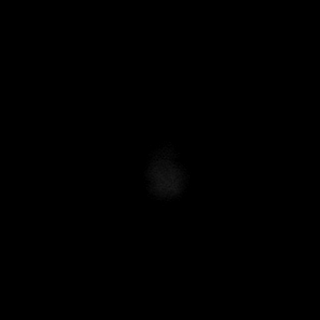

[Series 11: mag_images · axial · 3.0mm · 0.90mm/px · z∈[-52,+125]mm · 5 of 60 slices shown]
[im 1/60]
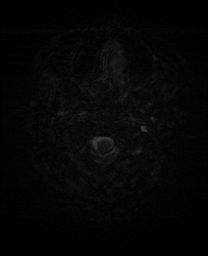
[im 15/60]
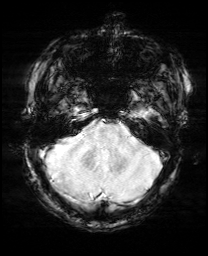
[im 30/60]
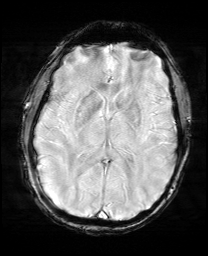
[im 45/60]
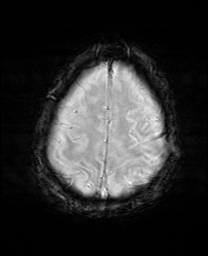
[im 60/60]
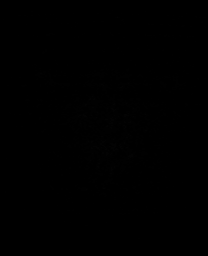

[Series 12: pha_images · axial · 3.0mm · 0.90mm/px · z∈[-52,+122]mm · 4 of 56 slices shown]
[im 1/56]
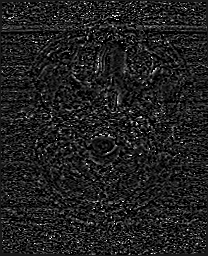
[im 19/56]
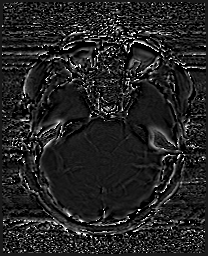
[im 37/56]
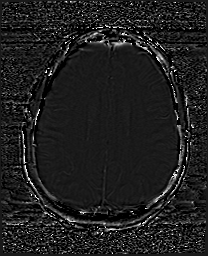
[im 56/56]
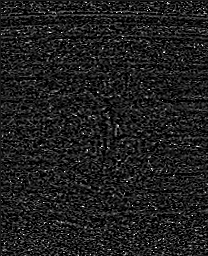

[Series 13: swi_images · axial · 3.0mm · 0.90mm/px · z∈[-52,+125]mm · 5 of 60 slices shown]
[im 1/60]
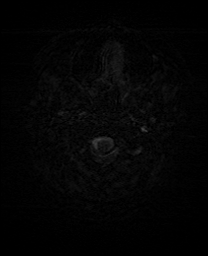
[im 15/60]
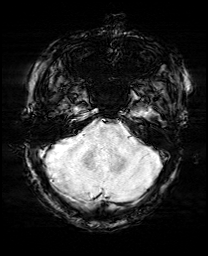
[im 30/60]
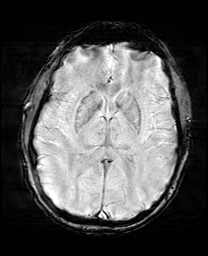
[im 45/60]
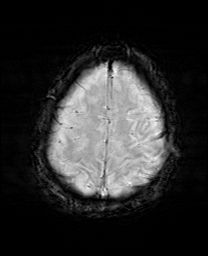
[im 60/60]
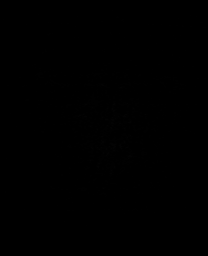

[Series 15: FLAIR · axial · 3.0mm · 0.45mm/px · z∈[-37,+110]mm · 4 of 50 slices shown]
[im 1/50]
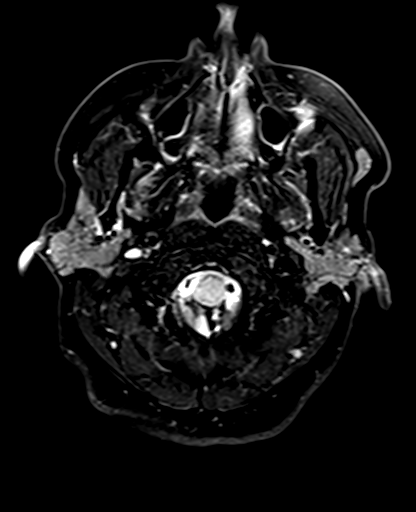
[im 17/50]
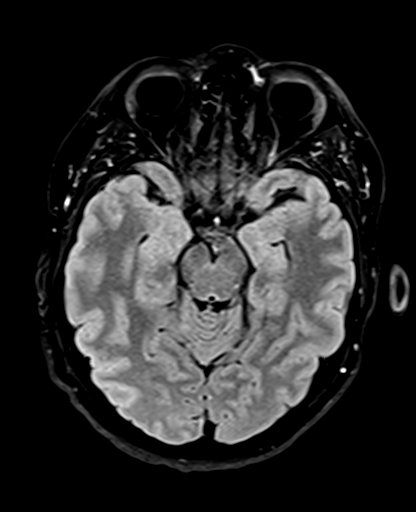
[im 33/50]
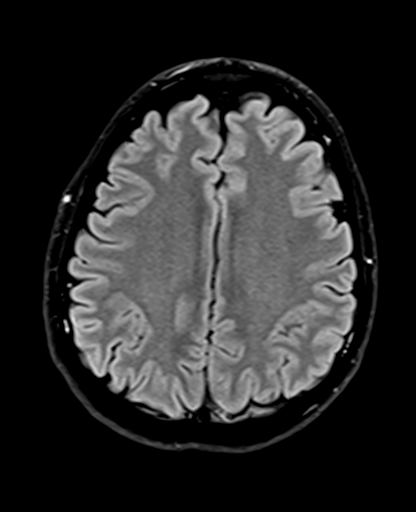
[im 50/50]
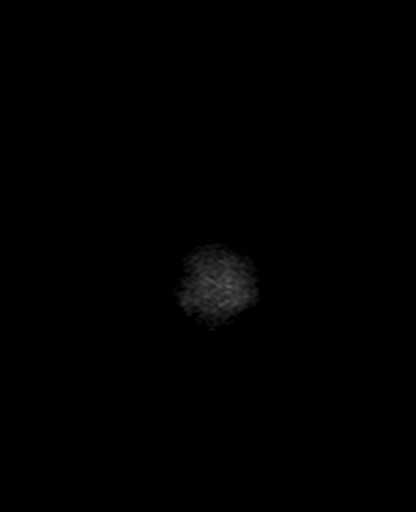

[Series 16: T1 · axial · 1.0mm · 0.98mm/px · z∈[-50,+125]mm · 13 of 169 slices shown (2 of 2)]
[im 1/169]
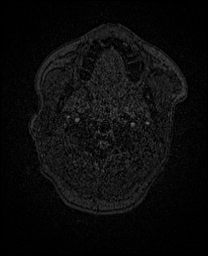
[im 15/169]
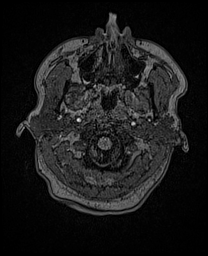
[im 29/169]
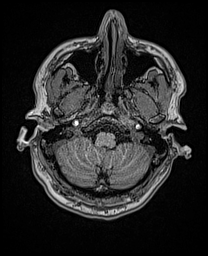
[im 43/169]
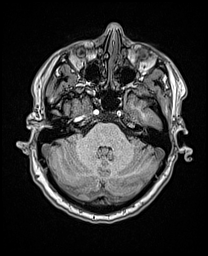
[im 57/169]
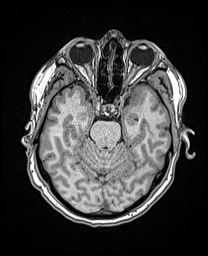
[im 71/169]
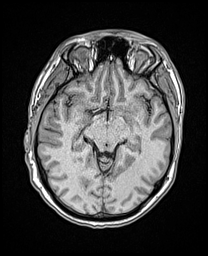
[im 85/169]
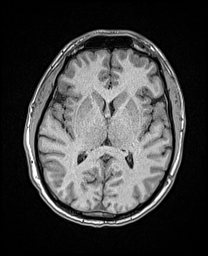
[im 99/169]
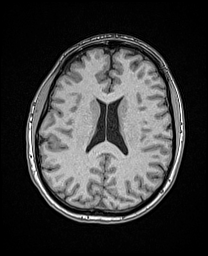
[im 113/169]
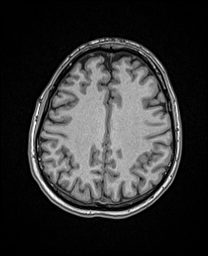
[im 127/169]
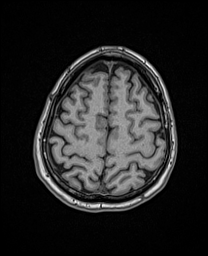
[im 141/169]
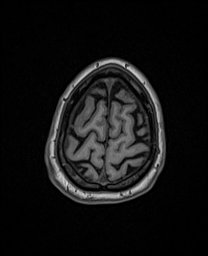
[im 155/169]
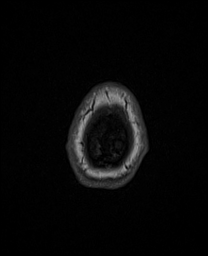
[im 169/169]
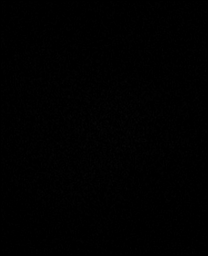

[Series 17: T2 · coronal · 5.0mm · 0.72mm/px · 2 of 28 slices shown (2 of 2)]
[im 1/28]
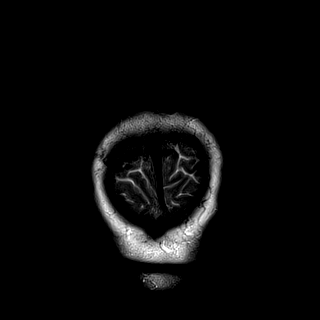
[im 28/28]
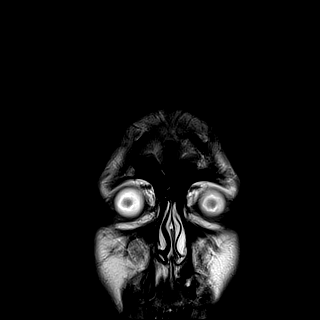

[48 of 48 positions shown; findings below may reference images not displayed]

FINDINGS: Brain: The brain has a normal appearance without evidence of
malformation, atrophy, old or acute small or large vessel
infarction, mass lesion, hemorrhage, hydrocephalus or extra-axial
collection.

Vascular: Major vessels at the base of the brain show flow. Venous
sinuses appear patent.

Skull and upper cervical spine: Normal.

Sinuses/Orbits: Clear/normal.

Other: None significant.
IMPRESSION: Normal examination. No abnormality seen to explain the presenting
symptoms.

These results will be called to the ordering clinician or
representative by the Radiologist Assistant, and communication
documented in the PACS or [REDACTED].

## 2023-07-11 DIAGNOSIS — I781 Nevus, non-neoplastic: Secondary | ICD-10-CM | POA: Diagnosis not present

## 2023-07-11 DIAGNOSIS — L570 Actinic keratosis: Secondary | ICD-10-CM | POA: Diagnosis not present

## 2023-09-19 DIAGNOSIS — M542 Cervicalgia: Secondary | ICD-10-CM | POA: Diagnosis not present

## 2023-09-27 DIAGNOSIS — M542 Cervicalgia: Secondary | ICD-10-CM | POA: Diagnosis not present

## 2023-10-03 DIAGNOSIS — M7918 Myalgia, other site: Secondary | ICD-10-CM | POA: Diagnosis not present

## 2023-10-03 DIAGNOSIS — M25512 Pain in left shoulder: Secondary | ICD-10-CM | POA: Diagnosis not present

## 2023-10-03 DIAGNOSIS — M5412 Radiculopathy, cervical region: Secondary | ICD-10-CM | POA: Diagnosis not present

## 2023-10-27 DIAGNOSIS — M25512 Pain in left shoulder: Secondary | ICD-10-CM | POA: Diagnosis not present

## 2024-01-09 ENCOUNTER — Other Ambulatory Visit: Payer: Self-pay | Admitting: Gastroenterology

## 2024-01-09 DIAGNOSIS — K219 Gastro-esophageal reflux disease without esophagitis: Secondary | ICD-10-CM

## 2024-01-17 DIAGNOSIS — R972 Elevated prostate specific antigen [PSA]: Secondary | ICD-10-CM | POA: Diagnosis not present

## 2024-01-25 DIAGNOSIS — E785 Hyperlipidemia, unspecified: Secondary | ICD-10-CM | POA: Diagnosis not present

## 2024-01-25 DIAGNOSIS — R972 Elevated prostate specific antigen [PSA]: Secondary | ICD-10-CM | POA: Diagnosis not present

## 2024-02-22 ENCOUNTER — Encounter: Payer: Self-pay | Admitting: Urology

## 2024-02-22 ENCOUNTER — Ambulatory Visit: Admitting: Urology

## 2024-02-22 VITALS — BP 123/69 | HR 79

## 2024-02-22 DIAGNOSIS — R972 Elevated prostate specific antigen [PSA]: Secondary | ICD-10-CM

## 2024-02-22 LAB — URINALYSIS, ROUTINE W REFLEX MICROSCOPIC
Bilirubin, UA: NEGATIVE
Glucose, UA: NEGATIVE
Ketones, UA: NEGATIVE
Leukocytes,UA: NEGATIVE
Nitrite, UA: NEGATIVE
Protein,UA: NEGATIVE
RBC, UA: NEGATIVE
Specific Gravity, UA: 1.02 (ref 1.005–1.030)
Urobilinogen, Ur: 0.2 mg/dL (ref 0.2–1.0)
pH, UA: 7 (ref 5.0–7.5)

## 2024-02-22 NOTE — Patient Instructions (Signed)

## 2024-02-22 NOTE — Progress Notes (Signed)
 02/22/2024 11:24 AM   Stephen [name] 1975-11-18 [id number]  Referring provider: Marvine Rush, MD [address] [place],  [place] [address]  Elevated PSA   HPI: Stephen [name] is a 48yo here for evaluation of elevated PSA. He had a PSA drawn Sept 2025 was 9.85. He was then evaluated by a Urologist at [facility] and PSA was 1.47. He then had the PSA rechecked and it was 2.6. No family hx of prostate cancer. IPSS 10 QOL 1.    PMH: Past Medical History:  Diagnosis Date   Elevated triglycerides with high cholesterol    GERD (gastroesophageal reflux disease)    Hemorrhoids     Surgical History: Past Surgical History:  Procedure Laterality Date   COLONOSCOPY  09/07/2005   RMR: Minimal internal hemorrhoids, otherwise normal rectum, normal appearing colonic mucosa, however poor prep compromised exam (MARGINAL PREP)   COLONOSCOPY N/A 08/17/2012   Procedure: COLONOSCOPY;  Surgeon: Lamar CHRISTELLA Hollingshead, MD;  [facility];  Service: Endoscopy;  Laterality: N/A;  10:45 AM-moved to [address] to notify pt   COLONOSCOPY WITH PROPOFOL  N/A 03/26/2021   Procedure: COLONOSCOPY WITH PROPOFOL ;  Surgeon: Hollingshead Lamar CHRISTELLA, MD;  Location: [facility];  Service: Endoscopy;  Laterality: N/A;  10:30am   ESOPHAGOGASTRODUODENOSCOPY (EGD) WITH PROPOFOL  N/A 03/26/2021   Procedure: ESOPHAGOGASTRODUODENOSCOPY (EGD) WITH PROPOFOL ;  Surgeon: Hollingshead Lamar CHRISTELLA, MD;  [facility]: [facility];  Service: Endoscopy;  Laterality: N/A;   MALONEY DILATION N/A 03/26/2021   Procedure: AGAPITO DILATION;  Surgeon: Hollingshead Lamar CHRISTELLA, MD;  Location: [facility];  Service: Endoscopy;  Laterality: N/A;   ROTATOR CUFF REPAIR     left    Home Medications:  Allergies as of 02/22/2024       Reactions   Penicillins Other (See Comments)   Does not know        Medication List        Accurate as of February 22, 2024 11:24 AM. If you have any questions, ask your nurse or doctor.          pantoprazole  40 MG  tablet Commonly known as: PROTONIX  Take 1 tablet (40 mg total) by mouth daily. Patient needs follow up appointment for future refills. Please call [phone] to schedule an appointment.   polyethylene glycol-electrolytes 420 g solution Commonly known as: TriLyte Take 4,000 mLs by mouth as directed.        Allergies:  Allergies  Allergen Reactions   Penicillins Other (See Comments)    Does not know     Family History: Family History  Problem Relation Age of Onset   Pulmonary embolism Mother    Cancer Mother    Colon cancer Neg Hx     Social History:  reports that he has never smoked. He has never used smokeless tobacco. He reports current alcohol use. He reports that he does not use drugs.  ROS: All other review of systems were reviewed and are negative except what is noted above in HPI  Physical Exam: BP 123/69   Pulse 79   Constitutional:  Alert and oriented, No acute distress. HEENT: [place] AT, moist mucus membranes.  Trachea midline, no masses. Cardiovascular: No clubbing, cyanosis, or edema. Respiratory: Normal respiratory effort, no increased work of breathing. GI: Abdomen is soft, nontender, nondistended, no abdominal masses GU: No CVA tenderness.  Lymph: No cervical or inguinal lymphadenopathy. Skin: No rashes, bruises or suspicious lesions. Neurologic: Grossly intact, no focal deficits, moving all 4  extremities. Psychiatric: Normal mood and affect.  Laboratory Data: Lab Results  Component Value Date   HGB 16.9 01/02/2007    Lab Results  Component Value Date   CREATININE 1.08 10/05/2022    No results found for: PSA  No results found for: TESTOSTERONE   No results found for: HGBA1C  Urinalysis No results found for: COLORURINE, APPEARANCEUR, LABSPEC, PHURINE, GLUCOSEU, HGBUR, BILIRUBINUR, KETONESUR, PROTEINUR, UROBILINOGEN, NITRITE, LEUKOCYTESUR  No results found for: LABMICR, WBCUA, RBCUA, LABEPIT, MUCUS,  BACTERIA  Pertinent Imaging:  Results for orders placed during the hospital encounter of 04/29/04  DG Abd 1 View  Narrative Clinical Data: abdominal pain; constipation ABDOMEN-FRONTAL VIEW: Comparison: none Findings: The amount of stool within the colon is within normal limits. No dilated small bowel is evident. No significant abnormal calcific densities are noted.  Impression Amount of stool in the colon is within normal limits.  Provider: Peggye Rung  No results found for this or any previous visit.  No results found for this or any previous visit.  No results found for this or any previous visit.  No results found for this or any previous visit.  No results found for this or any previous visit.  No results found for this or any previous visit.  No results found for this or any previous visit.   Assessment & Plan:    1. Elevated PSA (Primary) IsoPSA, will call with results. If normal I will see him back in 6 months with a PSA. If it is abnormal we will proceed with prostate MRI.  - Urinalysis, Routine w reflex microscopic   No follow-ups on file.  Belvie Clara, MD  [facility] [place]

## 2024-03-09 ENCOUNTER — Telehealth: Payer: Self-pay | Admitting: Urology

## 2024-03-09 NOTE — Telephone Encounter (Signed)
 [facility] called inquiring on patients IsoPsa results. Results are ready and are being faxed to office.        Message left informing patient lab results are being faxed over. Once received they will be sent to Dr. Sherrilee to review and someone will reach out with his recommendations.

## 2024-03-12 ENCOUNTER — Telehealth: Payer: Self-pay

## 2024-03-12 NOTE — Telephone Encounter (Signed)
 Patient called with no answer. Detailed message left informing patient per Dr. Sherrilee his IsoPsa was undetectable and he recommends him following up in 6 months with a PSA. Both appointments were already made.

## 2024-03-21 NOTE — Telephone Encounter (Signed)
 See other encounter

## 2024-08-14 ENCOUNTER — Other Ambulatory Visit

## 2024-08-22 ENCOUNTER — Ambulatory Visit: Admitting: Urology
